# Patient Record
Sex: Male | Born: 1955 | Race: White | Hispanic: No | Smoking: Former smoker
Health system: Southern US, Community
[De-identification: ages and names within clinical notes are randomized; demographics above are authoritative.]

## PROBLEM LIST (undated history)

## (undated) ENCOUNTER — Emergency Department (HOSPITAL_BASED_OUTPATIENT_CLINIC_OR_DEPARTMENT_OTHER): Admission: EM | Payer: 59 | Source: Home / Self Care

## (undated) DIAGNOSIS — E079 Disorder of thyroid, unspecified: Secondary | ICD-10-CM

## (undated) DIAGNOSIS — M109 Gout, unspecified: Secondary | ICD-10-CM

## (undated) DIAGNOSIS — H269 Unspecified cataract: Secondary | ICD-10-CM

## (undated) DIAGNOSIS — M1990 Unspecified osteoarthritis, unspecified site: Secondary | ICD-10-CM

## (undated) DIAGNOSIS — H409 Unspecified glaucoma: Secondary | ICD-10-CM

## (undated) DIAGNOSIS — D689 Coagulation defect, unspecified: Secondary | ICD-10-CM

## (undated) DIAGNOSIS — J45909 Unspecified asthma, uncomplicated: Secondary | ICD-10-CM

## (undated) DIAGNOSIS — E7800 Pure hypercholesterolemia, unspecified: Secondary | ICD-10-CM

## (undated) DIAGNOSIS — I10 Essential (primary) hypertension: Secondary | ICD-10-CM

## (undated) DIAGNOSIS — K5792 Diverticulitis of intestine, part unspecified, without perforation or abscess without bleeding: Secondary | ICD-10-CM

## (undated) DIAGNOSIS — N289 Disorder of kidney and ureter, unspecified: Secondary | ICD-10-CM

## (undated) HISTORY — DX: Unspecified osteoarthritis, unspecified site: M19.90

## (undated) HISTORY — DX: Unspecified glaucoma: H40.9

## (undated) HISTORY — DX: Disorder of kidney and ureter, unspecified: N28.9

## (undated) HISTORY — DX: Disorder of thyroid, unspecified: E07.9

## (undated) HISTORY — DX: Unspecified cataract: H26.9

## (undated) HISTORY — DX: Coagulation defect, unspecified: D68.9

## (undated) HISTORY — DX: Unspecified asthma, uncomplicated: J45.909

## (undated) HISTORY — PX: EYE SURGERY: SHX253

## (undated) HISTORY — PX: JOINT REPLACEMENT: SHX530

## (undated) HISTORY — PX: CERVICAL DISCECTOMY: SHX98

---

## 1998-08-29 ENCOUNTER — Other Ambulatory Visit: Admission: RE | Admit: 1998-08-29 | Discharge: 1998-08-29 | Payer: Self-pay | Admitting: Internal Medicine

## 1999-05-21 ENCOUNTER — Inpatient Hospital Stay (HOSPITAL_COMMUNITY): Admission: EM | Admit: 1999-05-21 | Discharge: 1999-05-23 | Payer: Self-pay | Admitting: Emergency Medicine

## 1999-05-21 ENCOUNTER — Encounter: Payer: Self-pay | Admitting: Emergency Medicine

## 1999-05-22 ENCOUNTER — Encounter: Payer: Self-pay | Admitting: General Surgery

## 2003-06-05 ENCOUNTER — Ambulatory Visit (HOSPITAL_COMMUNITY): Admission: RE | Admit: 2003-06-05 | Discharge: 2003-06-05 | Payer: Self-pay | Admitting: Neurosurgery

## 2003-12-17 ENCOUNTER — Emergency Department (HOSPITAL_COMMUNITY): Admission: EM | Admit: 2003-12-17 | Discharge: 2003-12-17 | Payer: Self-pay | Admitting: Family Medicine

## 2004-01-09 ENCOUNTER — Encounter: Admission: RE | Admit: 2004-01-09 | Discharge: 2004-01-09 | Payer: Self-pay | Admitting: Sports Medicine

## 2004-02-14 ENCOUNTER — Encounter: Admission: RE | Admit: 2004-02-14 | Discharge: 2004-02-14 | Payer: Self-pay | Admitting: Sports Medicine

## 2004-03-06 ENCOUNTER — Ambulatory Visit: Payer: Self-pay | Admitting: Family Medicine

## 2004-04-18 ENCOUNTER — Ambulatory Visit: Payer: Self-pay | Admitting: Family Medicine

## 2004-05-28 ENCOUNTER — Ambulatory Visit: Payer: Self-pay | Admitting: Family Medicine

## 2004-06-26 ENCOUNTER — Ambulatory Visit: Payer: Self-pay | Admitting: Family Medicine

## 2004-06-26 ENCOUNTER — Ambulatory Visit (HOSPITAL_COMMUNITY): Admission: RE | Admit: 2004-06-26 | Discharge: 2004-06-26 | Payer: Self-pay | Admitting: Family Medicine

## 2004-07-11 ENCOUNTER — Ambulatory Visit: Payer: Self-pay | Admitting: Sports Medicine

## 2004-07-11 ENCOUNTER — Ambulatory Visit (HOSPITAL_COMMUNITY): Admission: RE | Admit: 2004-07-11 | Discharge: 2004-07-11 | Payer: Self-pay | Admitting: Sports Medicine

## 2004-08-01 ENCOUNTER — Ambulatory Visit: Payer: Self-pay | Admitting: Family Medicine

## 2005-06-11 ENCOUNTER — Emergency Department (HOSPITAL_COMMUNITY): Admission: EM | Admit: 2005-06-11 | Discharge: 2005-06-11 | Payer: Self-pay | Admitting: Emergency Medicine

## 2005-06-15 ENCOUNTER — Ambulatory Visit: Payer: Self-pay | Admitting: Family Medicine

## 2006-01-28 ENCOUNTER — Inpatient Hospital Stay (HOSPITAL_COMMUNITY): Admission: EM | Admit: 2006-01-28 | Discharge: 2006-01-29 | Payer: Self-pay | Admitting: Emergency Medicine

## 2006-01-28 ENCOUNTER — Ambulatory Visit: Payer: Self-pay | Admitting: Family Medicine

## 2006-02-22 ENCOUNTER — Ambulatory Visit: Payer: Self-pay | Admitting: Family Medicine

## 2006-05-03 ENCOUNTER — Ambulatory Visit: Payer: Self-pay | Admitting: Family Medicine

## 2006-05-25 ENCOUNTER — Ambulatory Visit: Payer: Self-pay | Admitting: Family Medicine

## 2006-08-13 ENCOUNTER — Ambulatory Visit: Payer: Self-pay | Admitting: Family Medicine

## 2006-08-16 ENCOUNTER — Encounter: Payer: Self-pay | Admitting: Family Medicine

## 2006-08-16 ENCOUNTER — Ambulatory Visit: Payer: Self-pay | Admitting: Family Medicine

## 2006-08-16 LAB — CONVERTED CEMR LAB
Cholesterol: 138 mg/dL (ref 0–200)
HDL: 33 mg/dL — ABNORMAL LOW (ref >39)
Total CHOL/HDL Ratio: 4.2
Triglycerides: 208 mg/dL — ABNORMAL HIGH (ref <150)
VLDL: 42 mg/dL — ABNORMAL HIGH (ref 0–40)

## 2006-08-19 ENCOUNTER — Emergency Department (HOSPITAL_COMMUNITY): Admission: EM | Admit: 2006-08-19 | Discharge: 2006-08-19 | Payer: Self-pay | Admitting: Emergency Medicine

## 2006-09-02 DIAGNOSIS — E785 Hyperlipidemia, unspecified: Secondary | ICD-10-CM

## 2006-09-02 DIAGNOSIS — M545 Low back pain, unspecified: Secondary | ICD-10-CM | POA: Insufficient documentation

## 2006-09-02 DIAGNOSIS — E781 Pure hyperglyceridemia: Secondary | ICD-10-CM | POA: Insufficient documentation

## 2006-09-02 DIAGNOSIS — Z87891 Personal history of nicotine dependence: Secondary | ICD-10-CM

## 2006-09-02 DIAGNOSIS — E1169 Type 2 diabetes mellitus with other specified complication: Secondary | ICD-10-CM | POA: Insufficient documentation

## 2006-09-02 DIAGNOSIS — E119 Type 2 diabetes mellitus without complications: Secondary | ICD-10-CM | POA: Insufficient documentation

## 2006-10-14 ENCOUNTER — Telehealth: Payer: Self-pay | Admitting: *Deleted

## 2006-10-15 ENCOUNTER — Ambulatory Visit: Payer: Self-pay | Admitting: Family Medicine

## 2006-10-15 DIAGNOSIS — I10 Essential (primary) hypertension: Secondary | ICD-10-CM | POA: Insufficient documentation

## 2006-10-15 DIAGNOSIS — M549 Dorsalgia, unspecified: Secondary | ICD-10-CM | POA: Insufficient documentation

## 2006-11-10 ENCOUNTER — Telehealth (INDEPENDENT_AMBULATORY_CARE_PROVIDER_SITE_OTHER): Payer: Self-pay | Admitting: Family Medicine

## 2006-11-10 ENCOUNTER — Encounter: Payer: Self-pay | Admitting: Family Medicine

## 2006-11-15 ENCOUNTER — Encounter: Payer: Self-pay | Admitting: Family Medicine

## 2006-11-15 ENCOUNTER — Telehealth: Payer: Self-pay | Admitting: Family Medicine

## 2006-11-16 ENCOUNTER — Encounter: Payer: Self-pay | Admitting: Family Medicine

## 2006-11-25 ENCOUNTER — Telehealth: Payer: Self-pay | Admitting: *Deleted

## 2007-01-21 ENCOUNTER — Telehealth (INDEPENDENT_AMBULATORY_CARE_PROVIDER_SITE_OTHER): Payer: Self-pay | Admitting: Family Medicine

## 2007-02-04 ENCOUNTER — Telehealth (INDEPENDENT_AMBULATORY_CARE_PROVIDER_SITE_OTHER): Payer: Self-pay | Admitting: Family Medicine

## 2007-02-09 ENCOUNTER — Encounter (INDEPENDENT_AMBULATORY_CARE_PROVIDER_SITE_OTHER): Payer: Self-pay | Admitting: Family Medicine

## 2007-05-03 ENCOUNTER — Encounter (INDEPENDENT_AMBULATORY_CARE_PROVIDER_SITE_OTHER): Payer: Self-pay | Admitting: Family Medicine

## 2007-07-14 ENCOUNTER — Encounter (INDEPENDENT_AMBULATORY_CARE_PROVIDER_SITE_OTHER): Payer: Self-pay | Admitting: Family Medicine

## 2007-07-15 ENCOUNTER — Telehealth (INDEPENDENT_AMBULATORY_CARE_PROVIDER_SITE_OTHER): Payer: Self-pay | Admitting: Family Medicine

## 2007-10-31 ENCOUNTER — Telehealth: Payer: Self-pay | Admitting: *Deleted

## 2007-11-01 ENCOUNTER — Ambulatory Visit: Payer: Self-pay | Admitting: Family Medicine

## 2007-11-25 ENCOUNTER — Telehealth: Payer: Self-pay | Admitting: *Deleted

## 2007-12-19 ENCOUNTER — Telehealth: Payer: Self-pay | Admitting: *Deleted

## 2007-12-20 ENCOUNTER — Telehealth (INDEPENDENT_AMBULATORY_CARE_PROVIDER_SITE_OTHER): Payer: Self-pay | Admitting: Family Medicine

## 2008-01-19 IMAGING — CR DG LUMBAR SPINE COMPLETE 4+V
5 series · 5 of 5 positions shown · non-contrast
Comparison: MRI dated 06/05/03.

CLINICAL DATA: Mid low back pain for four days.  Multiple lower back surgeries.  History of cervical spine surgery.
 LUMBAR SPINE ? 4 VIEW ? 08/19/06:

[t l-spine a.p.]
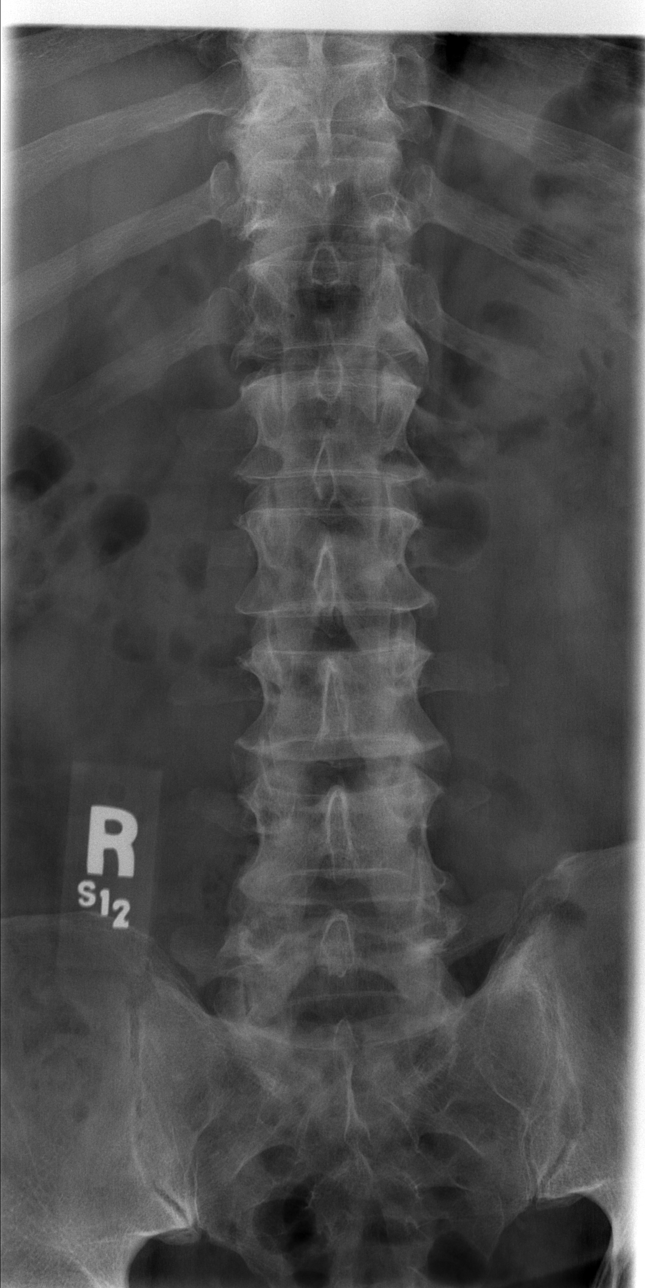

[t l-spine oblique exposure (1 of 2)]
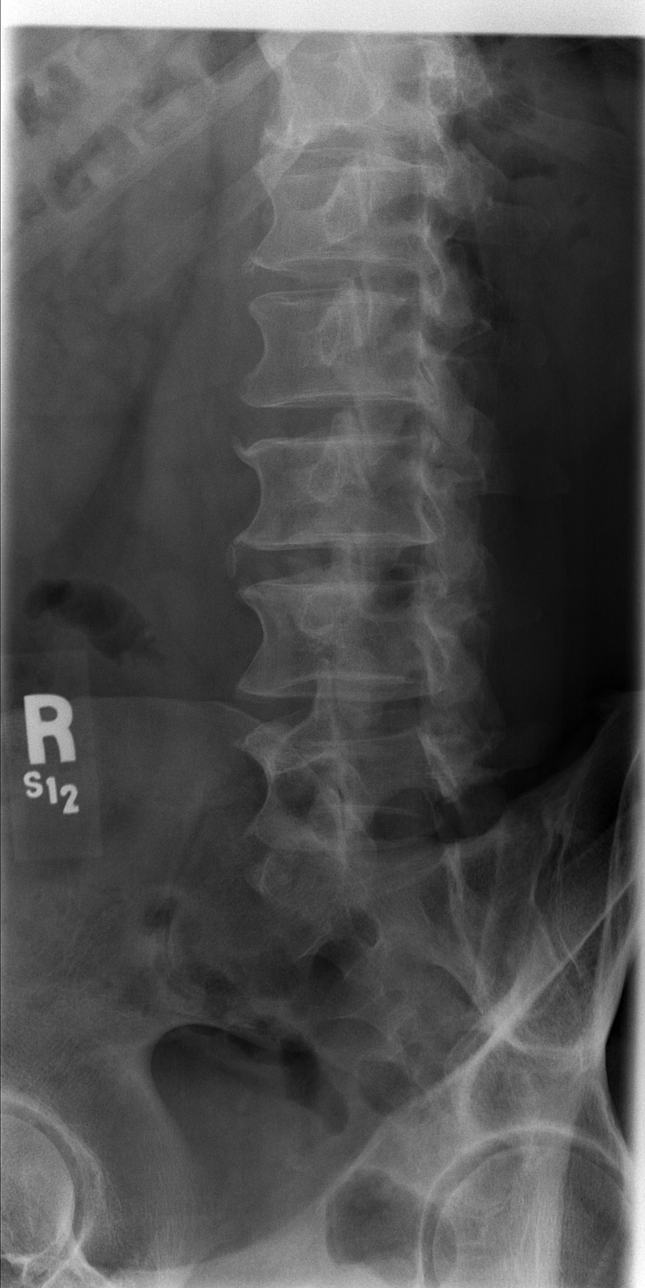

[t l-spine oblique exposure (2 of 2)]
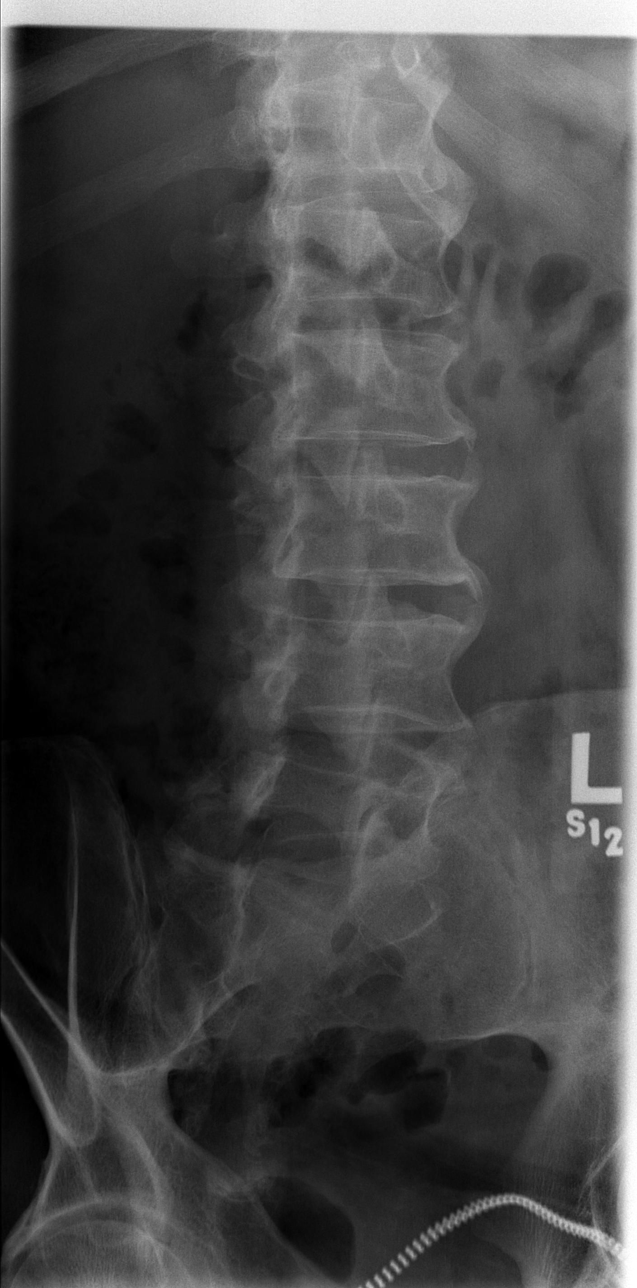

[t l-spine lat]
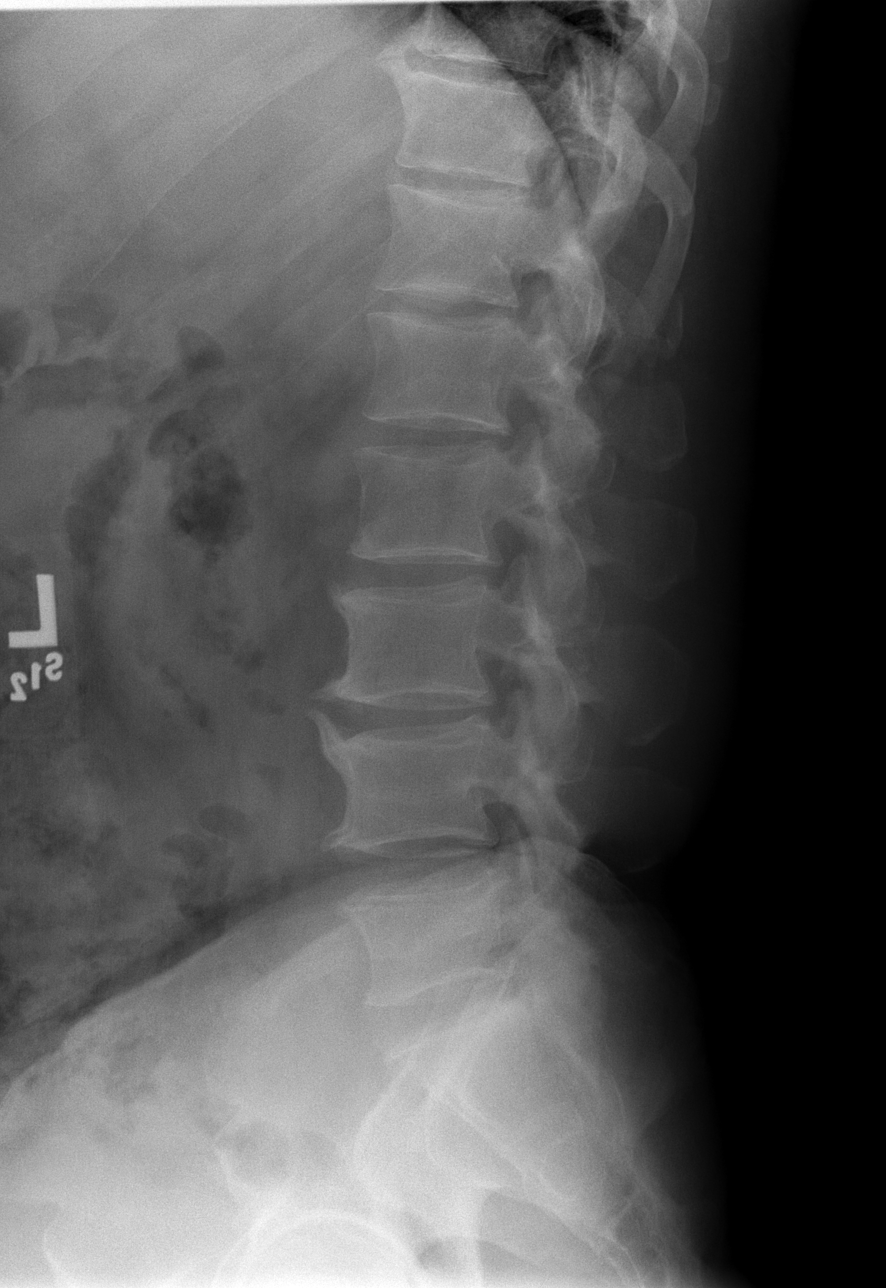

[t l-spine l5-s1 spot]
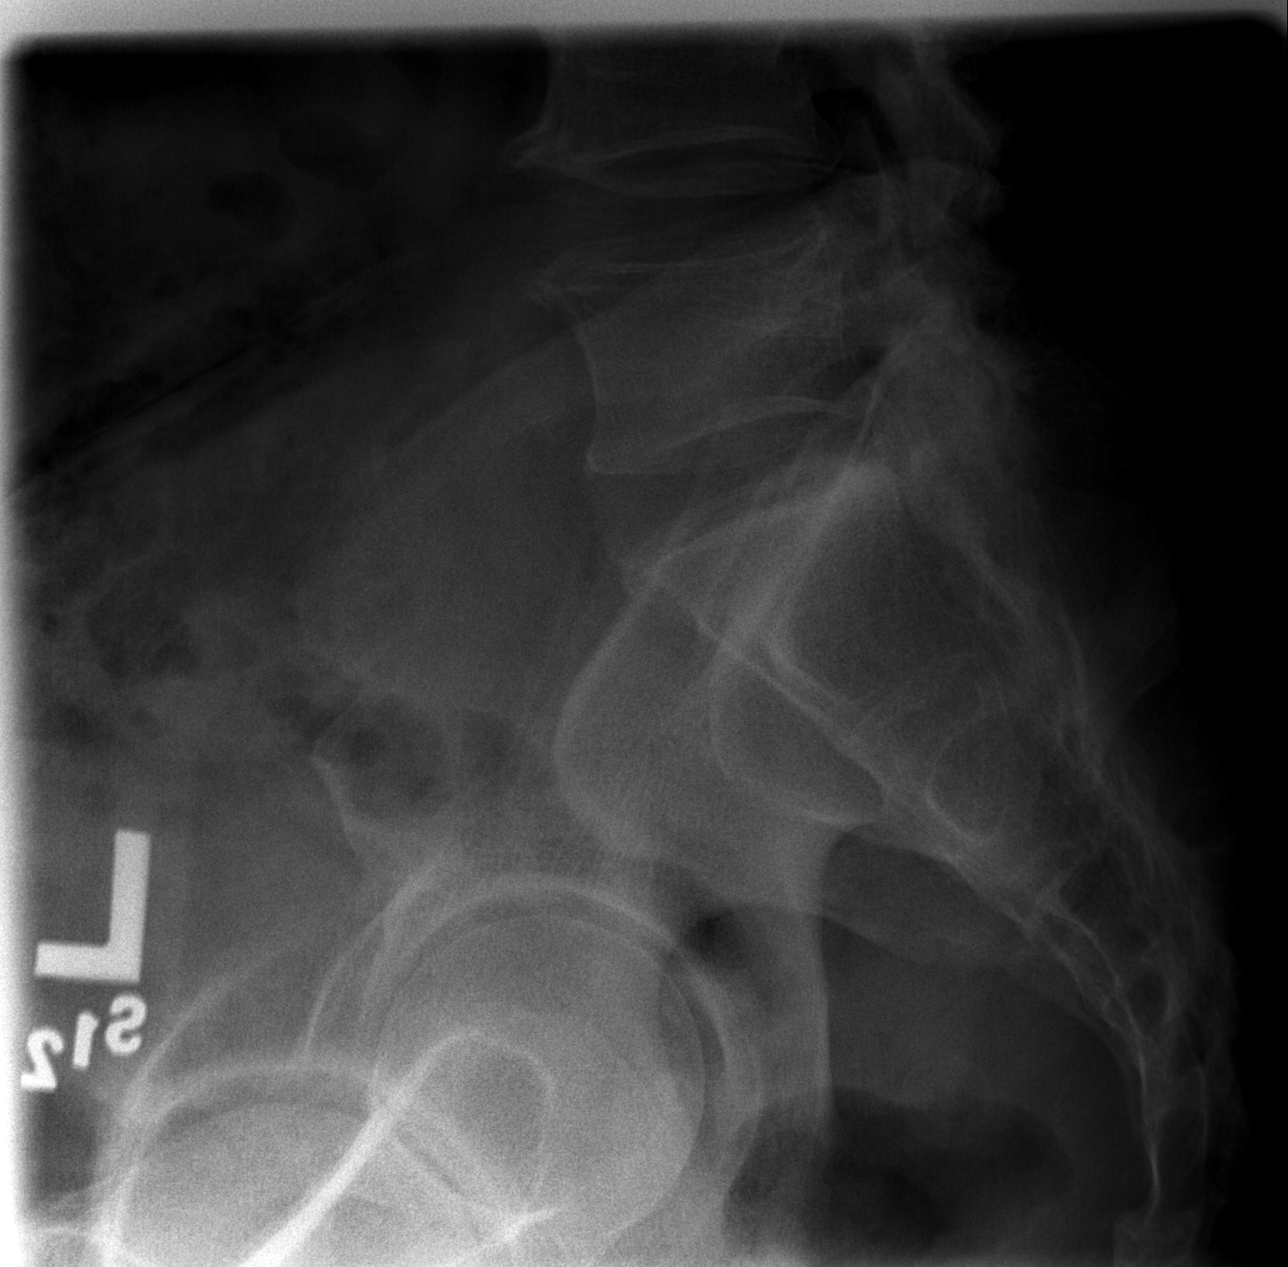

[5 of 5 positions shown; findings below may reference images not displayed]

FINDINGS: The alignment of the lumbar spine is normal.  The vertebral body heights and the disk spaces are well preserved. The facet joints are all well aligned.  The spinous processes, pedicles, and transverse processes are all normal.  There is mild multilevel anterior osteophyte formation.
IMPRESSION: No acute findings.

## 2008-03-28 ENCOUNTER — Encounter (INDEPENDENT_AMBULATORY_CARE_PROVIDER_SITE_OTHER): Payer: Self-pay | Admitting: Family Medicine

## 2008-03-28 ENCOUNTER — Ambulatory Visit: Payer: Self-pay | Admitting: Family Medicine

## 2008-03-28 DIAGNOSIS — E669 Obesity, unspecified: Secondary | ICD-10-CM

## 2008-03-28 DIAGNOSIS — M109 Gout, unspecified: Secondary | ICD-10-CM | POA: Insufficient documentation

## 2008-03-29 ENCOUNTER — Telehealth (INDEPENDENT_AMBULATORY_CARE_PROVIDER_SITE_OTHER): Payer: Self-pay | Admitting: Family Medicine

## 2008-04-18 ENCOUNTER — Telehealth: Payer: Self-pay | Admitting: *Deleted

## 2008-04-25 ENCOUNTER — Telehealth: Payer: Self-pay | Admitting: *Deleted

## 2008-06-24 LAB — CONVERTED CEMR LAB
CO2: 24 meq/L (ref 19–32)
Creatinine, Ser: 0.96 mg/dL (ref 0.40–1.50)
HDL: 38 mg/dL — ABNORMAL LOW (ref >39)
LDL Cholesterol: 135 mg/dL — ABNORMAL HIGH (ref 0–99)
VLDL: 32 mg/dL (ref 0–40)

## 2008-07-21 ENCOUNTER — Encounter (INDEPENDENT_AMBULATORY_CARE_PROVIDER_SITE_OTHER): Payer: Self-pay | Admitting: Family Medicine

## 2008-09-05 ENCOUNTER — Telehealth (INDEPENDENT_AMBULATORY_CARE_PROVIDER_SITE_OTHER): Payer: Self-pay | Admitting: Family Medicine

## 2008-09-10 ENCOUNTER — Telehealth (INDEPENDENT_AMBULATORY_CARE_PROVIDER_SITE_OTHER): Payer: Self-pay | Admitting: Family Medicine

## 2008-09-11 ENCOUNTER — Encounter (INDEPENDENT_AMBULATORY_CARE_PROVIDER_SITE_OTHER): Payer: Self-pay | Admitting: Family Medicine

## 2008-09-11 ENCOUNTER — Ambulatory Visit: Payer: Self-pay | Admitting: Family Medicine

## 2008-09-11 DIAGNOSIS — B351 Tinea unguium: Secondary | ICD-10-CM

## 2008-09-11 LAB — CONVERTED CEMR LAB: Hgb A1c MFr Bld: 7.7 %

## 2008-09-12 ENCOUNTER — Encounter (INDEPENDENT_AMBULATORY_CARE_PROVIDER_SITE_OTHER): Payer: Self-pay | Admitting: Family Medicine

## 2008-09-12 LAB — CONVERTED CEMR LAB
ALT: 39 units/L (ref 0–53)
AST: 25 units/L (ref 0–37)
CO2: 21 meq/L (ref 19–32)
Calcium: 10.1 mg/dL (ref 8.4–10.5)
Chloride: 105 meq/L (ref 96–112)
Cholesterol: 155 mg/dL (ref 0–200)
Creatinine, Ser: 0.83 mg/dL (ref 0.40–1.50)
HDL: 36 mg/dL — ABNORMAL LOW (ref >39)
Sodium: 140 meq/L (ref 135–145)
Total CHOL/HDL Ratio: 4.3

## 2008-09-25 ENCOUNTER — Telehealth (INDEPENDENT_AMBULATORY_CARE_PROVIDER_SITE_OTHER): Payer: Self-pay | Admitting: *Deleted

## 2008-10-04 ENCOUNTER — Telehealth: Payer: Self-pay | Admitting: *Deleted

## 2008-10-08 ENCOUNTER — Ambulatory Visit: Payer: Self-pay | Admitting: Family Medicine

## 2008-10-19 ENCOUNTER — Telehealth (INDEPENDENT_AMBULATORY_CARE_PROVIDER_SITE_OTHER): Payer: Self-pay | Admitting: *Deleted

## 2008-12-03 ENCOUNTER — Emergency Department (HOSPITAL_COMMUNITY): Admission: EM | Admit: 2008-12-03 | Discharge: 2008-12-04 | Payer: Self-pay | Admitting: Emergency Medicine

## 2009-02-27 ENCOUNTER — Ambulatory Visit: Payer: Self-pay | Admitting: Family Medicine

## 2009-02-27 DIAGNOSIS — N529 Male erectile dysfunction, unspecified: Secondary | ICD-10-CM | POA: Insufficient documentation

## 2009-02-27 LAB — CONVERTED CEMR LAB: Hgb A1c MFr Bld: 7.5 %

## 2009-03-05 ENCOUNTER — Telehealth: Payer: Self-pay | Admitting: Family Medicine

## 2009-04-15 ENCOUNTER — Telehealth (INDEPENDENT_AMBULATORY_CARE_PROVIDER_SITE_OTHER): Payer: Self-pay | Admitting: *Deleted

## 2009-09-25 ENCOUNTER — Telehealth: Payer: Self-pay | Admitting: *Deleted

## 2009-10-03 ENCOUNTER — Telehealth: Payer: Self-pay | Admitting: Family Medicine

## 2009-10-04 ENCOUNTER — Encounter: Payer: Self-pay | Admitting: *Deleted

## 2010-08-07 NOTE — Miscellaneous (Signed)
Summary: Pt now inactive - moved to [place]  Pt moved to [place].  Dennison Nancy RN  October 04, 2009 11:27 AM

## 2010-08-07 NOTE — Progress Notes (Signed)
Summary: DMV info  ---- Converted from flag ---- ---- 09/27/2009 2:56 PM, Earl Harvey CMA wrote: spoke with [name] and he said that this form in NOT for a DOT license but for a regular license.  I informed him that he would need to be seen by you before you would do anything and he would like for you  to please call him [phone].  thanks :-)  called pt and he is going to get a physician in [place].  has appt scheduled.  new physician will fill out form.  he is getting ROI to [place] so that he can get our records to new physician ------------------------------

## 2010-08-07 NOTE — Progress Notes (Signed)
Summary: phn msg  Phone Note Outgoing Call   Summary of Call: Would you call Mr Stuck and ask him to come in for an appointment.  He has not been seen since October and he has left a DMV form form me to fill out.  The medicines he listed on his form do not completely match the list I have.  I cannot fill out his form until I see him.  Thanks! Initial call taken by: Asher Muir MD,  September 25, 2009 2:06 PM  Follow-up for Phone Call        Pt says that he [place] come [place]  [place]  [phone].   Follow-up by: Clydell Hakim,  September 26, 2009 10:06 AM  Additional Follow-up for Phone Call Additional follow up Details #1::        Then he will need to find a physician in [place] to see him and fill out this form, and more importantly, to help take care of his diabetes.  We can send him back the form if he needs it. Additional Follow-up by: Asher Muir MD,  September 26, 2009 10:28 AM    mr. Newbury stated that this will mess him up with obtaining his [place] license and that he had accidentaly filled out incorrect information about the dosage or type of medication that he is taking and he was informed that only the last doctor that has seen him last can fill out this paperwork for him. i told him that you wanted to see him in order to do this, but he stated that because he has just started his job he cannot take time off of work to do this.Loralee Pacas CMA  September 26, 2009 3:34 PM  Appended Document: phn msg I am sorry that he feels that way, but he must get a physician in [place].

## 2010-10-13 LAB — GLUCOSE, CAPILLARY: Glucose-Capillary: 162 mg/dL — ABNORMAL HIGH (ref 70–99)

## 2010-11-21 NOTE — Discharge Summary (Signed)
NAMEJAXYN, Earl Harvey                 ACCOUNT NO.:  [id number]   MEDICAL RECORD NO.:  [id number]          PATIENT TYPE:  INP   LOCATION:  3736                         FACILITY:  [facility]   PHYSICIAN:  Leighton Roach McDiarmid, M.D.DATE OF BIRTH:  June 09, 1956   DATE OF ADMISSION:  01/28/2006  DATE OF DISCHARGE:  01/29/2006                                 DISCHARGE SUMMARY   DISCHARGE DIAGNOSES:  1.  Atypical chest pain.  2.  Hyperkalemia.  3.  Muscle twitches.  4.  Diabetes mellitus type 2.  5.  Gout.  6.  Hypertension.   DISCHARGE MEDICATIONS:  1.  Lisinopril 10 mg daily.  2.  Aspirin 81 mg daily.  3.  NPH insulin 45 units every morning, 23 units every night.   FOLLOWUP:  The patient is to return to Dr. Tanya Nones at the [facility] on August 8th at 3:45 p.m.   CONSULTS:  None.   PROCEDURES:  None.   HOSPITAL COURSE BY PROBLEM:  1.  Chest flutter:  [name] is a 55 year old white male with a history of      diabetes who complains of a 2-3 months feeling of a fluttering in his      left chest with no pain or pressure, no associated nausea, vomiting,      diaphoresis or radiation.  The fluttering increased in frequency on the      date of admission.  He also has had the same feeling in his lower      abdomen at times.  The differential for his chest flutter included      muscle twitch, atrial fibrillation, atrial flutter, other irregular      heart beats.  We admitted the patient for 23-hour observation to rule      out myocardial infarction.  We admitted to him to telemetry to watch for      arrhythmias, PBCs, PACs, any kind of other arrhythmias.  The patient      remained in normal sinus rhythm over night, and his chest fluttering      symptoms resolved shortly after being started on IV fluids.  Patient      also, the following morning, described sometimes that his pectoralis      muscle on the left side would draw up with the fluttering.  This      combined with  negative cardiac workup including 3 sets of negative      cardiac enzymes and 2 EKGs showing normal sinus rhythm makes the      diagnosis of muscle twitch the most likely especially given that his      symptoms resolved with hydration.  We discharged the patient on 81 mg of      aspirin daily, and was instructions to continue his lisinopril and      insulin to minimize his risk factors for cardiac disease.  2.  Hyperkalemia:  On admission, the patient was found to have a potassium      of 5.4.  Over night, the patient's potassium decreased to within normal  limits.  Likely his potassium was increased secondary to dehydration due      to hyperglycemia.  Patient will need followup BMP.  3.  Diabetes mellitus type 2:  Patient is poorly controlled with an A1c of      9.5.  He reports that he cannot pay for his      Lantus, and has been borrowing his friend's, which he has extra.  We      switched him to an equivalent dose of NPH.  Patient will need followup      A1c in 3 months.  4.  Hypertension:  Patient with stable blood pressure during this admission.      We continued his lisinopril.     ______________________________  Levander Campion, M.D.    ______________________________  Leighton Roach McDiarmid, M.D.    JH/MEDQ  D:  02/01/2006  T:  02/02/2006  Job:  [id number]   cc:   Leighton Roach McDiarmid, M.D.  Fax: [phone]   Priscille Heidelberg. Pamalee Leyden, MD  Fax: [phone]
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
## 2011-05-19 ENCOUNTER — Encounter: Payer: Self-pay | Admitting: Family Medicine

## 2011-05-19 ENCOUNTER — Ambulatory Visit (INDEPENDENT_AMBULATORY_CARE_PROVIDER_SITE_OTHER): Payer: Self-pay | Admitting: Family Medicine

## 2011-05-19 VITALS — BP 121/82 | HR 76 | Ht 69.0 in | Wt 229.0 lb

## 2011-05-19 DIAGNOSIS — I10 Essential (primary) hypertension: Secondary | ICD-10-CM

## 2011-05-19 DIAGNOSIS — E782 Mixed hyperlipidemia: Secondary | ICD-10-CM | POA: Insufficient documentation

## 2011-05-19 DIAGNOSIS — E1159 Type 2 diabetes mellitus with other circulatory complications: Secondary | ICD-10-CM | POA: Insufficient documentation

## 2011-05-19 DIAGNOSIS — E1169 Type 2 diabetes mellitus with other specified complication: Secondary | ICD-10-CM | POA: Insufficient documentation

## 2011-05-19 DIAGNOSIS — E119 Type 2 diabetes mellitus without complications: Secondary | ICD-10-CM

## 2011-05-19 DIAGNOSIS — E785 Hyperlipidemia, unspecified: Secondary | ICD-10-CM | POA: Insufficient documentation

## 2011-05-19 DIAGNOSIS — H547 Unspecified visual loss: Secondary | ICD-10-CM | POA: Insufficient documentation

## 2011-05-19 DIAGNOSIS — M109 Gout, unspecified: Secondary | ICD-10-CM | POA: Insufficient documentation

## 2011-05-19 LAB — POCT GLYCOSYLATED HEMOGLOBIN (HGB A1C): Hemoglobin A1C: 8.7

## 2011-05-19 MED ORDER — METFORMIN HCL 1000 MG PO TABS: 1000.0000 mg | ORAL_TABLET | Freq: Two times a day (BID) | ORAL | Status: DC

## 2011-05-19 MED ORDER — LISINOPRIL-HYDROCHLOROTHIAZIDE 20-25 MG PO TABS: 1.0000 | ORAL_TABLET | Freq: Every day | ORAL | Status: DC

## 2011-05-19 MED ORDER — PRAVASTATIN SODIUM 40 MG PO TABS: 40.0000 mg | ORAL_TABLET | Freq: Every day | ORAL | Status: DC

## 2011-05-19 MED ORDER — AMLODIPINE BESYLATE 5 MG PO TABS: 5.0000 mg | ORAL_TABLET | Freq: Every day | ORAL | Status: DC

## 2011-05-19 MED ORDER — ALLOPURINOL 300 MG PO TABS: 300.0000 mg | ORAL_TABLET | ORAL | Status: DC

## 2011-05-19 NOTE — Progress Notes (Signed)
Subjective:    Patient ID: Earl Harvey, male    DOB: Nov 26, 1955, 55 y.o.   MRN: [id number]  Diabetes Earl Harvey presents for his follow-up diabetic visit. Earl Harvey has type 1 diabetes mellitus. His disease course has been worsening. There are no hypoglycemic associated symptoms. Associated symptoms include blurred vision, fatigue, foot paresthesias, visual change and weakness. There are no hypoglycemic complications. Symptoms are worsening. Diabetic complications include impotence, nephropathy, peripheral neuropathy and PVD. Pertinent negatives for diabetic complications include no CVA or heart disease. Risk factors for coronary artery disease include male sex, sedentary lifestyle, stress, hypertension, diabetes mellitus, dyslipidemia and family history. Current diabetic treatment includes insulin injections and oral agent (monotherapy). His weight is stable. Earl Harvey is following a diabetic diet. Meal planning includes avoidance of concentrated sweets. Earl Harvey has not had a previous visit with a dietician. Earl Harvey rarely participates in exercise. An ACE inhibitor/angiotensin II receptor blocker is being taken. Earl Harvey does not see a podiatrist.Eye exam is not current.  Hypertension This is a chronic problem. The current episode started more than 1 year ago. Associated symptoms include blurred vision. Risk factors for coronary artery disease include male gender, obesity, dyslipidemia, diabetes mellitus and family history. Past treatments include ACE inhibitors. Compliance problems include exercise, diet and medication cost.  Hypertensive end-organ damage includes PVD. There is no history of CVA.   Earl Harvey is taking lisinopril 20/25 twice a day explained to him this is double the maximum dosage. Earl Harvey states that a doctor prescribed that but explained to him that if they can get blood work showing that his kidney function is acceptable as one thing since Earl Harvey is unable to afford lab work I can't not prescribe this dosage and we'll need to add another  blood pressure medicine to his regimen.   Visual impairment Earl Harvey reports feeling like Earl Harvey needs to go on disability because of his visual problems. Earl Harvey states is worse with driving at night and that Earl Harvey has trouble with his vision.     Neuropathy Earl Harvey reports loss of sensation in both lower extremities especially his right   Gout Earl Harvey is taking his allopurinol 3 times a week Earl Harvey states that his uric acid levels have been fine that way. Test Earl Harvey does not want any blood work drawn because of the cost I will re\re prescribe his gout medicine that way Review of Systems  Constitutional: Positive for fatigue.  Eyes: Positive for blurred vision and visual disturbance.  Genitourinary: Positive for impotence.  Musculoskeletal: Positive for myalgias, arthralgias and gait problem.  Neurological: Positive for weakness.   BP 121/82  Pulse 76  Ht 5\' 9"  (1.753 m)  Wt 229 lb (103.874 kg)  BMI 33.82 kg/m2  SpO2 98% Results for orders placed in visit on 05/19/11  POCT GLYCOSYLATED HEMOGLOBIN (HGB A1C)      Component Value Range   Hemoglobin A1C 8.7         Objective:   Physical Exam  Constitutional: Earl Harvey is oriented to person, place, and time. Earl Harvey appears well-developed and well-nourished.  HENT:  Head: Normocephalic.  Neck: Normal range of motion. Neck supple.  Cardiovascular: Normal rate, regular rhythm and normal heart sounds.   Pulmonary/Chest: Effort normal and breath sounds normal.  Musculoskeletal: Normal range of motion.  Neurological: Earl Harvey is alert and oriented to person, place, and time.  Skin: Skin is warm and dry.  Psychiatric: Earl Harvey has a normal mood and affect.          Assessment & Plan:  #1 diabetes  repeatedly Earl Harvey's deformity Earl Harvey has no funds to see other doctors make any major changes in medication or purchase things aren't we talked about. Earl Harvey wants to go to the novel and 7030 explained to him that for now for some 30 to be a step back from the Lantus a significant step back from Lantus and a  few seconds about diabetes Earl Harvey cannot go to 7030 been on the Lantus and expect to change his diabetes. Didn't increase his Lantus to 100 units Earl Harvey does have him about this and will use that once I spoke with Earl Harvey seems to go back to the novel and 7030. Earl Harvey is on metformin twice a day will add Januvia 100 mg one tablet a day and samples given to patient last obese 8-10 weeks until [facility] [place].  #2 visual impairment patient is legally blind in the right eye. 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 Explained Earl Harvey needs to see an ophthalmologist and that if Earl Harvey wants to really look disability Earl Harvey may be disabled because of his visual impairment. Once again because Earl Harvey has no insurance or funds Earl [facility] [place]. #3 hypertension as above I cannot ask her to continue on the Lotensin 20/25 twice a day will add Norvasc 10 mg explained that this should be hopefully in the $4 class.  #4 hyperlipidemia unable to obtain lipid panel we'll maintain him on his Pravachol.  Return when either Earl Harvey says is on  the [employer], Earl Harvey gets insurance, disability, or she's done anything [facility].

## 2011-05-19 NOTE — Patient Instructions (Signed)
Diabetes and Foot Care Diabetes may cause you to have a poor blood supply (circulation) to your legs and feet. Because of this, the skin may be thinner, break easier, and heal more slowly. You also may have nerve damage in your legs and feet causing decreased feeling. You may not notice minor injuries to your feet that could lead to serious problems or infections. Taking care of your feet is one of the most important things you can do for yourself.  HOME CARE INSTRUCTIONS  Do not go barefoot. Bare feet are easily injured.   Check your feet daily for blisters, cuts, and redness.   Wash your feet with warm water (not hot) and mild soap. Pat your feet and between your toes until completely dry.   Apply a moisturizing lotion that does not contain alcohol or petroleum jelly to the dry skin on your feet and to dry brittle toenails. Do not put it between your toes.   Trim your toenails straight across. Do not dig under them or around the cuticle.   Do not cut corns or calluses, or try to remove them with medicine.   Wear clean cotton socks or stockings every day. Make sure they are not too tight. Do not wear knee high stockings since they may decrease blood flow to your legs.   Wear leather shoes that fit properly and have enough cushioning. To break in new shoes, wear them just a few hours a day to avoid injuring your feet.   Wear shoes at all times, even in the house.   Do not cross your legs. This may decrease the blood flow to your feet.   If you find a minor scrape, cut, or break in the skin on your feet, keep it and the skin around it clean and dry. These areas may be cleansed with mild soap and water. Do not use peroxide, alcohol, iodine or Merthiolate.   When you remove an adhesive bandage, be sure not to harm the skin around it.   If you have a wound, look at it several times a day to make sure it is healing.   Do not use heating pads or hot water bottles. Burns can occur. If you have  lost feeling in your feet or legs, you may not know it is happening until it is too late.   Report any cuts, sores or bruises to your caregiver. Do not wait!  SEEK MEDICAL CARE IF:   You have an injury that is not healing or you notice redness, numbness, burning, or tingling.   Your feet always feel cold.   You have pain or cramps in your legs and feet.  SEEK IMMEDIATE MEDICAL CARE IF:   There is increasing redness, swelling, or increasing pain in the wound.   There is a red line that goes up your leg.   Pus is coming from a wound.   You develop an unexplained oral temperature above 102 F (38.9 C), or as your caregiver suggests.   You notice a bad smell coming from an ulcer or wound.  MAKE SURE YOU:   Understand these instructions.   Will watch your condition.   Will get help right away if you are not doing well or get worse.  Document Released: 06/19/2000 Document Revised: 03/04/2011 Document Reviewed: 12/26/2008 [facility] Patient Information 2012 [place], [place].Driving and Equipment Restrictions Some medical problems make it dangerous to drive, ride a bike, or use machines. Some of these problems are:  A hard  blow to the head (concussion).   Passing out (fainting).   Twitching and shaking (seizures).   Low blood sugar.   Taking medicine to help you relax (sedatives).   Taking pain medicines.   Wearing an eye patch.   Wearing splints. This can make it hard to use parts of your body that you need to drive safely.  HOME CARE   Do not drive until your doctor says it is okay.   Do not use machines until your doctor says it is okay.  You may need a form signed by your doctor (medical release) before you can drive again. You may also need this form before you do other tasks where you need to be fully alert. MAKE SURE YOU:  Understand these instructions.   Will watch your condition.   Will get help right away if you are not doing well or get worse.  Document  Released: 07/30/2004 Document Revised: 03/04/2011 Document Reviewed: 10/30/2009 [facility] Patient Information 2012 [place], [place].

## 2011-05-21 NOTE — Progress Notes (Signed)
Addended by: Hassan Rowan on: 05/21/2011 04:16 PM   Modules accepted: Orders

## 2011-05-25 ENCOUNTER — Encounter (HOSPITAL_COMMUNITY): Payer: Self-pay | Admitting: *Deleted

## 2011-05-25 ENCOUNTER — Emergency Department (HOSPITAL_COMMUNITY)
Admission: EM | Admit: 2011-05-25 | Discharge: 2011-05-25 | Disposition: A | Discharge status: Home / Self Care | Payer: Self-pay | Attending: Emergency Medicine | Admitting: Emergency Medicine

## 2011-05-25 DIAGNOSIS — E119 Type 2 diabetes mellitus without complications: Secondary | ICD-10-CM | POA: Insufficient documentation

## 2011-05-25 DIAGNOSIS — H538 Other visual disturbances: Secondary | ICD-10-CM | POA: Insufficient documentation

## 2011-05-25 DIAGNOSIS — H5710 Ocular pain, unspecified eye: Secondary | ICD-10-CM | POA: Insufficient documentation

## 2011-05-25 DIAGNOSIS — Z794 Long term (current) use of insulin: Secondary | ICD-10-CM | POA: Insufficient documentation

## 2011-05-25 DIAGNOSIS — I10 Essential (primary) hypertension: Secondary | ICD-10-CM | POA: Insufficient documentation

## 2011-05-25 DIAGNOSIS — Z79899 Other long term (current) drug therapy: Secondary | ICD-10-CM | POA: Insufficient documentation

## 2011-05-25 DIAGNOSIS — H547 Unspecified visual loss: Secondary | ICD-10-CM | POA: Insufficient documentation

## 2011-05-25 HISTORY — DX: Essential (primary) hypertension: I10

## 2011-05-25 MED ORDER — TETRACAINE HCL 0.5 % OP SOLN
1.0000 [drp] | Freq: Once | OPHTHALMIC | Status: AC
  Administered 2011-05-25: 1 [drp] via OPHTHALMIC
  Filled 2011-05-25: qty 2

## 2011-05-25 NOTE — ED Provider Notes (Signed)
History     CSN: [id number] Arrival date & time: 05/25/2011  4:50 PM   First MD Initiated Contact with Patient 05/25/11 1920      Chief Complaint  Patient presents with  . Loss of Vision    (Consider location/radiation/quality/duration/timing/severity/associated sxs/prior treatment) Patient is a 55 y.o. male presenting with eye problem. The history is provided by the patient.  Eye Problem  This is a chronic problem. Episode onset: worsening over many years. The problem occurs constantly. The problem has not changed since onset.There is pain in the right eye. There was no injury mechanism. The patient is experiencing no pain. There is no history of trauma to the eye. There is no known exposure to pink eye. He does not wear contacts. Associated symptoms include blurred vision and decreased vision. Pertinent negatives include no discharge, no foreign body sensation, no eye redness, no nausea and no vomiting. He has tried nothing for the symptoms.    Past Medical History  Diagnosis Date  . Diabetes mellitus   . Hypertension     History reviewed. No pertinent past surgical history.  No family history on file.  History  Substance Use Topics  . Smoking status: Former Games developer  . Smokeless tobacco: Not on file  . Alcohol Use: Yes      Review of Systems  Constitutional: Negative for fever.  Eyes: Positive for blurred vision. Negative for pain, discharge, redness and itching.  Respiratory: Negative for cough and shortness of breath.   Cardiovascular: Negative for chest pain.  Gastrointestinal: Negative for nausea, vomiting, abdominal pain and diarrhea.  Neurological: Negative for headaches.  All other systems reviewed and are negative.    Allergies  Review of patient's allergies indicates no known allergies.  Home Medications   Current Outpatient Rx  Name Route Sig Dispense Refill  . ALLOPURINOL 300 MG PO TABS Oral Take 1 tablet (300 mg total) by mouth every other day. 30  tablet 4  . OMEGA-3 FATTY ACIDS 1000 MG PO CAPS Oral Take 2 g by mouth daily.      Marland Kitchen GARLIC 500 MG PO TABS Oral Take 1 tablet by mouth daily.      . INSULIN GLARGINE 100 UNIT/ML [place] SOLN Subcutaneous Inject 100 Units into the skin daily.      Marland Kitchen LISINOPRIL-HYDROCHLOROTHIAZIDE 20-25 MG PO TABS Oral Take 1 tablet by mouth daily. 30 tablet 4  . METFORMIN HCL 1000 MG PO TABS Oral Take 1 tablet (1,000 mg total) by mouth 2 (two) times daily with a meal. 60 tablet 4  . THERA M PLUS PO TABS Oral Take 1 tablet by mouth daily.      Marland Kitchen PRAVASTATIN SODIUM 40 MG PO TABS Oral Take 1 tablet (40 mg total) by mouth daily. 30 tablet 4  . SITAGLIPTIN PHOSPHATE 100 MG PO TABS Oral Take 100 mg by mouth daily.        BP 117/73  Pulse 74  Temp(Src) 98.3 F (36.8 C) (Oral)  Resp 18  SpO2 99%  Physical Exam  Nursing note and vitals reviewed. Constitutional: He is oriented to person, place, and time. He appears well-developed and well-nourished. No distress.  HENT:  Head: Normocephalic and atraumatic.  Eyes: EOM are normal. Pupils are equal, round, and reactive to light.       tono pressures less than 10 in both eyes  Cardiovascular: Normal rate, regular rhythm and normal heart sounds.   Pulmonary/Chest: Effort normal and breath sounds normal. No respiratory distress.  Abdominal: Soft.  He exhibits no distension. There is no tenderness.  Musculoskeletal: Normal range of motion.  Neurological: He is alert and oriented to person, place, and time.  Skin: Skin is warm and dry.  Psychiatric: He has a normal mood and affect.    ED Course  Procedures (including critical care time)  Labs Reviewed - No data to display No results found.   No diagnosis found.    MDM  7:29 PM Pt seen and examined. Pt with chronic decrease in vision of right eye. He is trying to be evaulated by someone who can tell him there is something that can be corrected or if he is going to live with the rest of his life. Eye pressure is  not elevated in either eye. No recent change in vision concerning for acute worsening of ongoing issues. Will recommend he see any optometrist for a full eye exam as they might cost less than a formal ophthalmology eval.        Daleen Bo 05/26/11 0042

## 2011-05-25 NOTE — ED Notes (Signed)
He has been a diabetic for 20 years and for the past months he has been having vision problems for weeks.  He was seen in [place] last week for the same.  He has lost the vision in his rt eye for several weeks

## 2011-05-25 NOTE — ED Provider Notes (Signed)
I saw and evaluated the patient, reviewed the resident's note and I agree with the findings and plan.  Patient is a long-standing diabetic currently does not have any primary care has had trouble with vision and that seemed med [facility] for this a few days ago and was referred to an eye physician patient came here for further evaluation stating that not able to afford an ophthalmologist. Gen. I. exam in ED including pressures were normal but due to his diabetes is good chance it may be some retinal problems patient given referral to some optometrists in his local area and also the possibility of going to back to side clinic. Patient will explore trying to get seen by an optometrist locally and will return for any new or worse symptoms.  Shelda Jakes, MD 05/25/11 2037

## 2011-07-21 ENCOUNTER — Other Ambulatory Visit: Payer: Self-pay | Admitting: *Deleted

## 2011-07-21 MED ORDER — INSULIN GLARGINE 100 UNIT/ML ~~LOC~~ SOLN: 100.0000 [IU] | Freq: Every day | SUBCUTANEOUS | Status: DC

## 2011-07-21 MED ORDER — SITAGLIPTIN PHOSPHATE 100 MG PO TABS: 100.0000 mg | ORAL_TABLET | Freq: Every day | ORAL | Status: DC

## 2011-08-06 ENCOUNTER — Telehealth: Payer: Self-pay | Admitting: *Deleted

## 2011-08-06 NOTE — Telephone Encounter (Signed)
Patient has been referred in the past to various diabetic treatment programs. I realize he has horrible diabetes no insurance no funds and multiple medical problems. He is he requesting [place] to [facility] and they're going to take over his care which is fine by me or is it for a particular diabetic program that I am unaware of that they require referral for. To the best of my knowledge he does not need to be managed by [place] and [facility] at the same time.

## 2011-08-06 NOTE — Telephone Encounter (Signed)
Pt states that he is trying to get help with meds and diabetic care and would like to know if you can refer him to [facility] at [phone]. States that he called [facility] and they told him that he would need to be referred to them. Pt states he has no insurance.

## 2011-08-07 NOTE — Telephone Encounter (Signed)
Pt states that he needs you to refer to them b/c he has no insurance and that they will take care of his medical needs but states they told him that he needs you to refer him. Please advise

## 2011-08-11 NOTE — Telephone Encounter (Signed)
No problem please make the referral but this will be for them to take care of him for primary care and we will send the notes that we have and we wish him well but we will terminate our relationship because he does not need 2 PCP managing his care.  Any thing further from [place] will require him to reestablish himself (with the provider accepting)with this office.

## 2011-08-11 NOTE — Telephone Encounter (Signed)
Called [facility] to make referral but the lady there states they don't accept referrals from any provider and [place] is not one they accept from. States the pt needs to find PCP that can refer to them. I have informed pt that he needs to contact them in regards to a PCP and call us back to send his records when he has another PCP. Informed pt that if he left this clinic that he would have to be re-established providing the provider accepts if he ever wants to return here

## 2011-10-14 ENCOUNTER — Other Ambulatory Visit: Payer: Self-pay | Admitting: *Deleted

## 2011-10-14 DIAGNOSIS — I10 Essential (primary) hypertension: Secondary | ICD-10-CM

## 2011-10-14 MED ORDER — LISINOPRIL-HYDROCHLOROTHIAZIDE 20-25 MG PO TABS: 1.0000 | ORAL_TABLET | Freq: Every day | ORAL | Status: DC

## 2012-02-26 ENCOUNTER — Other Ambulatory Visit: Payer: Self-pay | Admitting: Family Medicine

## 2012-04-20 ENCOUNTER — Other Ambulatory Visit: Payer: Self-pay | Admitting: Family Medicine

## 2012-04-20 NOTE — Telephone Encounter (Signed)
Must make appt

## 2012-04-23 ENCOUNTER — Encounter (HOSPITAL_COMMUNITY): Payer: Self-pay | Admitting: Emergency Medicine

## 2012-04-23 ENCOUNTER — Emergency Department (HOSPITAL_COMMUNITY)
Admission: EM | Admit: 2012-04-23 | Discharge: 2012-04-23 | Disposition: A | Discharge status: Home / Self Care | Payer: Self-pay | Attending: Emergency Medicine | Admitting: Emergency Medicine

## 2012-04-23 DIAGNOSIS — E7800 Pure hypercholesterolemia, unspecified: Secondary | ICD-10-CM | POA: Insufficient documentation

## 2012-04-23 DIAGNOSIS — I10 Essential (primary) hypertension: Secondary | ICD-10-CM | POA: Insufficient documentation

## 2012-04-23 DIAGNOSIS — Z79899 Other long term (current) drug therapy: Secondary | ICD-10-CM | POA: Insufficient documentation

## 2012-04-23 DIAGNOSIS — E119 Type 2 diabetes mellitus without complications: Secondary | ICD-10-CM | POA: Insufficient documentation

## 2012-04-23 DIAGNOSIS — R21 Rash and other nonspecific skin eruption: Secondary | ICD-10-CM | POA: Insufficient documentation

## 2012-04-23 DIAGNOSIS — M109 Gout, unspecified: Secondary | ICD-10-CM | POA: Insufficient documentation

## 2012-04-23 DIAGNOSIS — Z207 Contact with and (suspected) exposure to pediculosis, acariasis and other infestations: Secondary | ICD-10-CM

## 2012-04-23 DIAGNOSIS — Z2089 Contact with and (suspected) exposure to other communicable diseases: Secondary | ICD-10-CM | POA: Insufficient documentation

## 2012-04-23 HISTORY — DX: Gout, unspecified: M10.9

## 2012-04-23 HISTORY — DX: Pure hypercholesterolemia, unspecified: E78.00

## 2012-04-23 MED ORDER — PERMETHRIN 5 % EX CREA: TOPICAL_CREAM | CUTANEOUS | Status: DC

## 2012-04-23 NOTE — ED Provider Notes (Signed)
History     CSN: [id number]  Arrival date & time 04/23/12  1028   First MD Initiated Contact with Patient 04/23/12 1122      Chief Complaint  Patient presents with  . Rash    (Consider location/radiation/quality/duration/timing/severity/associated sxs/prior treatment) HPI Comments: Patient is a 56 year old male that presents emergency department complaining of rash.  Patient states that his sisters family was diagnosed with scabies and treated and her symptoms resolved.  He was evaluated for similar and diagnosed with scabies as well.  He completed the permethrin cream treatment a week and half ago, but states that he is still having extreme pruritus at night primarily in the groin and hands.  When asked if he cleaned his home thoroughly patient reports that he was unaware he was supposed to be doing this.  No other complaints at this time.  Patient is a 56 y.o. male presenting with rash. The history is provided by the patient.  Rash  This is a new problem.    Past Medical History  Diagnosis Date  . Diabetes mellitus   . Hypertension   . Gout   . Hypercholesteremia     History reviewed. No pertinent past surgical history.  History reviewed. No pertinent family history.  History  Substance Use Topics  . Smoking status: Former Games developer  . Smokeless tobacco: Not on file  . Alcohol Use: Yes      Review of Systems  Skin: Positive for rash.  All other systems reviewed and are negative.    Allergies  Review of patient's allergies indicates no known allergies.  Home Medications   Current Outpatient Rx  Name Route Sig Dispense Refill  . METFORMIN HCL 1000 MG PO TABS Oral Take 1,000 mg by mouth 2 (two) times daily with a meal.    . ALLOPURINOL 300 MG PO TABS Oral Take 1 tablet (300 mg total) by mouth every other day. 30 tablet 4  . OMEGA-3 FATTY ACIDS 1000 MG PO CAPS Oral Take 2 g by mouth daily.      Marland Kitchen GARLIC 500 MG PO TABS Oral Take 1 tablet by mouth daily.      .  INSULIN GLARGINE 100 UNIT/ML [place] SOLN Subcutaneous Inject 100 Units into the skin daily. 10 mL 6  . LISINOPRIL-HYDROCHLOROTHIAZIDE 20-25 MG PO TABS  TAKE ONE TABLET BY MOUTH EVERY DAY 30 tablet 0    MUST MAKE APPOINTMENT  . THERA M PLUS PO TABS Oral Take 1 tablet by mouth daily.      Marland Kitchen PRAVASTATIN SODIUM 40 MG PO TABS Oral Take 1 tablet (40 mg total) by mouth daily. 30 tablet 4  . SITAGLIPTIN PHOSPHATE 100 MG PO TABS Oral Take 1 tablet (100 mg total) by mouth daily. 30 tablet 6    BP 151/93  Pulse 94  Temp 98.6 F (37 C) (Oral)  Resp 18  SpO2 99%  Physical Exam  Nursing note and vitals reviewed. Constitutional: He is oriented to person, place, and time. He appears well-developed and well-nourished. No distress.  HENT:  Head: Normocephalic and atraumatic.  Eyes: Conjunctivae normal and EOM are normal.  Neck: Normal range of motion.  Pulmonary/Chest: Effort normal.  Musculoskeletal: Normal range of motion.  Neurological: He is alert and oriented to person, place, and time.  Skin: Skin is warm and dry. Rash noted. He is not diaphoretic.       excurations & black linear dots in groin & upper extremity flexor surfaces.   Psychiatric: He has a  normal mood and affect. His behavior is normal.    ED Course  Procedures (including critical care time)  Labs Reviewed - No data to display No results found.   No diagnosis found.    MDM  Pt w scabies exposure presents with persistent rash after permethrin treatment. advised to followup with her primary care doctor 2 weeks after treatment.  They have also been advised to clean entire household including washing sheets and using R.I.D. spray in the car and on sofa.   The use of permethrin cream was discussed as well          Jaci Carrel, PA-C 04/23/12 1129

## 2012-04-23 NOTE — Progress Notes (Signed)
   CARE MANAGEMENT NOTE 04/23/2012  Patient:  Earl Harvey, Earl Harvey   Account Number:  [id number]  Date Initiated:  04/23/2012  Documentation initiated by:  [facility]  Subjective/Objective Assessment:   scabies     Action/Plan:   med assistance   Anticipated DC Date:  04/23/2012   Anticipated DC Plan:  HOME/SELF CARE      DC Planning Services  CM consult  Medication Assistance      Choice offered to / List presented to:             Status of service:  Completed, signed off Medicare Important Message given?   (If response is "NO", the following Medicare IM given date fields will be blank) Date Medicare IM given:   Date Additional Medicare IM given:    Discharge Disposition:  HOME/SELF CARE  Per UR Regulation:    If discussed at Long Length of Stay Meetings, dates discussed:    Comments:  04/23/2012 1200 Call received from ED, stating pt was d/c from ED and he told RN that he could not afford his medication. NCM contacted main pharmacy and pt is eligible for [facility] med assistance fund. Contacted pt d/c ED RN and explain to send Rx to main pharmacy. Pt was d/c and waiting in holding area for assistance. ED RN will go pick up from main pharmacy when ready. Isidoro Donning RN CCM Case Mgmt phone [phone]

## 2012-04-23 NOTE — ED Notes (Signed)
Pt sts rash and bites x several weeks that pt thinks could be scabies

## 2012-04-24 NOTE — ED Provider Notes (Signed)
Medical screening examination/treatment/procedure(s) were performed by non-physician practitioner and as supervising physician I was immediately available for consultation/collaboration.   Joya Gaskins, MD 04/24/12 [phone]

## 2012-06-22 ENCOUNTER — Emergency Department (HOSPITAL_COMMUNITY)
Admission: EM | Admit: 2012-06-22 | Discharge: 2012-06-22 | Disposition: A | Discharge status: Home / Self Care | Payer: Self-pay

## 2012-09-19 ENCOUNTER — Emergency Department (HOSPITAL_COMMUNITY)
Admission: EM | Admit: 2012-09-19 | Discharge: 2012-09-19 | Disposition: A | Discharge status: Home / Self Care | Payer: 59 | Attending: Emergency Medicine | Admitting: Emergency Medicine

## 2012-09-19 ENCOUNTER — Encounter (HOSPITAL_COMMUNITY): Payer: Self-pay | Admitting: Family Medicine

## 2012-09-19 DIAGNOSIS — M545 Low back pain, unspecified: Secondary | ICD-10-CM | POA: Insufficient documentation

## 2012-09-19 DIAGNOSIS — M109 Gout, unspecified: Secondary | ICD-10-CM | POA: Insufficient documentation

## 2012-09-19 DIAGNOSIS — Z79899 Other long term (current) drug therapy: Secondary | ICD-10-CM | POA: Insufficient documentation

## 2012-09-19 DIAGNOSIS — Z87891 Personal history of nicotine dependence: Secondary | ICD-10-CM | POA: Insufficient documentation

## 2012-09-19 DIAGNOSIS — E119 Type 2 diabetes mellitus without complications: Secondary | ICD-10-CM | POA: Insufficient documentation

## 2012-09-19 DIAGNOSIS — Z8719 Personal history of other diseases of the digestive system: Secondary | ICD-10-CM | POA: Insufficient documentation

## 2012-09-19 DIAGNOSIS — M549 Dorsalgia, unspecified: Secondary | ICD-10-CM

## 2012-09-19 DIAGNOSIS — E7800 Pure hypercholesterolemia, unspecified: Secondary | ICD-10-CM | POA: Insufficient documentation

## 2012-09-19 DIAGNOSIS — I10 Essential (primary) hypertension: Secondary | ICD-10-CM | POA: Insufficient documentation

## 2012-09-19 DIAGNOSIS — Z794 Long term (current) use of insulin: Secondary | ICD-10-CM | POA: Insufficient documentation

## 2012-09-19 HISTORY — DX: Diverticulitis of intestine, part unspecified, without perforation or abscess without bleeding: K57.92

## 2012-09-19 MED ORDER — HYDROCODONE-ACETAMINOPHEN 5-325 MG PO TABS: 1.0000 | ORAL_TABLET | ORAL | Status: DC | PRN

## 2012-09-19 MED ORDER — IBUPROFEN 800 MG PO TABS: 800.0000 mg | ORAL_TABLET | Freq: Three times a day (TID) | ORAL | Status: AC

## 2012-09-19 MED ORDER — CYCLOBENZAPRINE HCL 10 MG PO TABS: 10.0000 mg | ORAL_TABLET | Freq: Two times a day (BID) | ORAL | Status: DC | PRN

## 2012-09-19 NOTE — ED Notes (Signed)
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

## 2012-09-19 NOTE — ED Provider Notes (Signed)
History    This chart was scribed for non-physician practitioner Elpidio Anis, PA-C working with Gilda Crease, MD by Gerlean Ren, ED Scribe. This patient was seen in room WTR7/WTR7 and the patient's care was started at 10:01 PM.    CSN: [id number]  Arrival date & time 09/19/12  2008   First MD Initiated Contact with Patient 09/19/12 2141      Chief Complaint  Patient presents with  . Back Pain     The history is provided by the patient. No language interpreter was used.  Earl Harvey is a 57 y.o. male with h/o DM, HTN, gout who presents to the Emergency Department complaining of lower back pain that has been gradually worsening over the past several months while he has been working as a Development worker, international aid numerous deliveries daily.  Pt reports the pain will suddenly grab him and is worsened when ambulating.  No associated injuries.  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  Pt has never seen orthopedist for this pain or any prior pains.   PCP at [facility]. Past Medical History  Diagnosis Date  . Diabetes mellitus   . Hypertension   . Gout   . Hypercholesteremia   . Diverticulitis     Past Surgical History  Procedure Laterality Date  . Cervical discectomy      No family history on file.  History  Substance Use Topics  . Smoking status: Former Games developer  . Smokeless tobacco: Not on file  . Alcohol Use: Yes     Comment: Occasionally      Review of Systems  Musculoskeletal: Positive for back pain.  Skin: Negative for wound.  All other systems reviewed and are negative.    Allergies  Review of patient's allergies indicates no known allergies.  Home Medications   Current Outpatient Rx  Name  Route  Sig  Dispense  Refill  . allopurinol (ZYLOPRIM) 300 MG tablet   Oral   Take 1 tablet (300 mg total) by mouth every other day.   30 tablet   4   . fish oil-omega-3 fatty acids 1000 MG capsule   Oral   Take 2 g by mouth  daily.           . Garlic 500 MG TABS   Oral   Take 1 tablet by mouth daily.           . insulin glargine (LANTUS) 100 UNIT/ML injection   Subcutaneous   Inject 100 Units into the skin every morning.         Marland Kitchen lisinopril-hydrochlorothiazide (PRINZIDE,ZESTORETIC) 20-25 MG per tablet   Oral   Take 1 tablet by mouth daily.         Marland Kitchen lovastatin (MEVACOR) 20 MG tablet   Oral   Take 20 mg by mouth every other day.         . metFORMIN (GLUCOPHAGE) 1000 MG tablet   Oral   Take 1,000 mg by mouth 2 (two) times daily with a meal.         . Multiple Vitamins-Minerals (MULTIVITAMINS THER. W/MINERALS) TABS   Oral   Take 1 tablet by mouth daily.             BP 161/87  Pulse 85  Temp(Src) 98.4 F (36.9 C) (Oral)  Resp 20  Ht 5\' 9"  (1.753 m)  Wt 230 lb (104.327 kg)  BMI 33.95 kg/m2  SpO2 98%  Physical Exam  Nursing note and  vitals reviewed. Constitutional: He is oriented to person, place, and time. He appears well-developed and well-nourished. No distress.  HENT:  Head: Normocephalic and atraumatic.  Eyes: EOM are normal.  Neck: Neck supple. No tracheal deviation present.  Cardiovascular: Normal rate.   Pulmonary/Chest: Effort normal. No respiratory distress.  Musculoskeletal: Normal range of motion.  Lower back is non-tender without swelling  Neurological: He is alert and oriented to person, place, and time.  Hyporeflexic but equal  Skin: Skin is warm and dry.  Psychiatric: He has a normal mood and affect. His behavior is normal.    ED Course  Procedures (including critical care time) DIAGNOSTIC STUDIES: Oxygen Saturation is 98% on room air, normal by my interpretation.    COORDINATION OF CARE: 10:05 PM- Patient informed of clinical course, understands medical decision-making process, and agrees with plan.  Labs Reviewed - No data to display No results found.   No diagnosis found.  1. Muscular low back pain  MDM  Uncomplicated back pain that  follows muscular pattern. No neurologic abnormalities. I personally performed the services described in this documentation, which was scribed in my presence. The recorded information has been reviewed and is accurate.          Junius Finner, PA-C 09/19/12 2208

## 2012-09-19 NOTE — ED Provider Notes (Signed)
Medical screening examination/treatment/procedure(s) were performed by non-physician practitioner and as supervising physician I was immediately available for consultation/collaboration.  Gilda Crease, MD 09/19/12 [phone]

## 2014-01-29 LAB — HM COLONOSCOPY

## 2014-02-01 ENCOUNTER — Encounter: Payer: Self-pay | Admitting: Internal Medicine

## 2019-07-16 ENCOUNTER — Other Ambulatory Visit: Payer: Self-pay

## 2019-07-16 ENCOUNTER — Ambulatory Visit
Admission: EM | Admit: 2019-07-16 | Discharge: 2019-07-16 | Disposition: A | Discharge status: Home / Self Care | Payer: BC Managed Care – PPO

## 2019-07-16 DIAGNOSIS — Z20822 Contact with and (suspected) exposure to covid-19: Secondary | ICD-10-CM

## 2019-07-16 DIAGNOSIS — I10 Essential (primary) hypertension: Secondary | ICD-10-CM | POA: Diagnosis not present

## 2019-07-16 NOTE — Discharge Instructions (Signed)
COVID testing ordered. As discussed, given recent exposure without symptoms, you may still be in incubation period. Monitor for any symptoms such as cough, congestion, shortness of breath, loss of taste/smell, fever, to start self quarantine and may need retesting. Go to the emergency department for further evaluation if you develop significant shortness of breath, cannot speak in full sentences.   To make appointment: You can text "COVID" to [phone] OR log on to [email or web address] If no smart phone or PC, you can call [phone] for assistance in setting up appointments.  COVID drive through sites:  [place]: [address] : [address] : [address] 

## 2019-07-16 NOTE — ED Triage Notes (Signed)
Pt states his daughter is covid positive last Wednesday, states has been with her prior to that. Requesting test, denies sx's

## 2019-07-16 NOTE — ED Provider Notes (Signed)
[facility]    CSN: [id number] Arrival date & time: 07/16/19  1058      History   Chief Complaint Chief Complaint  Patient presents with  . covid test    HPI Earl Harvey is a 64 y.o. male.   64 year old male with history of DM, HLD, HTN comes in for COVID testing after positive exposure 6 days ago. Daughter tested positive for COVID 5 days ago. Patient remains asymptomatic. Denies URI symptoms such as cough, congestion, sore throat. Denies fever, chills, body aches. Denies abdominal pain, nausea, vomiting, diarrhea. Denies shortness of breath, loss of taste/smell. No antipyretics in the last 8 hours.       Past Medical History:  Diagnosis Date  . Diabetes mellitus   . Diverticulitis   . Gout   . Hypercholesteremia   . Hypertension     Patient Active Problem List   Diagnosis Date Noted  . Gout   . Hypercholesteremia   . DM (diabetes mellitus) (HCC) 05/19/2011  . Hyperlipidemia 05/19/2011  . Hypertension 05/19/2011  . Gout 05/19/2011  . Visual impairment 05/19/2011  . 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 GOUT, UNSPECIFIED 03/28/2008  . OBESITY 03/28/2008  . HYPERTENSION 10/15/2006  . BACK PAIN 10/15/2006  . Type II or unspecified type diabetes mellitus without mention of complication, not stated as uncontrolled 09/02/2006  . HYPERTRIGLYCERIDEMIA 09/02/2006  . HYPERLIPIDEMIA 09/02/2006  . BACK PAIN, LOW 09/02/2006  . TOBACCO USE, QUIT 09/02/2006    Past Surgical History:  Procedure Laterality Date  . CERVICAL DISCECTOMY         Home Medications    Prior to Admission medications   Medication Sig Start Date End Date Taking? Authorizing Provider  glimepiride (AMARYL) 2 MG tablet Take 2 mg by mouth daily with breakfast.   Yes [provider]  allopurinol (ZYLOPRIM) 300 MG tablet Take 1 tablet (300 mg total) by mouth every other day. 05/19/11   Hassan Rowan, MD  fish oil-omega-3 fatty  acids 1000 MG capsule Take 2 g by mouth daily.      [provider]  Garlic 500 MG TABS Take 1 tablet by mouth daily.      [provider]  ibuprofen (ADVIL,MOTRIN) 800 MG tablet Take 1 tablet (800 mg total) by mouth 3 (three) times daily. 09/19/12   Elpidio Anis, PA-C  insulin glargine (LANTUS) 100 UNIT/ML injection Inject 100 Units into the skin every morning. 07/21/11   Hassan Rowan, MD  lisinopril-hydrochlorothiazide (PRINZIDE,ZESTORETIC) 20-25 MG per tablet Take 1 tablet by mouth daily.    [provider]  lovastatin (MEVACOR) 20 MG tablet Take 20 mg by mouth every other day.    [provider]  metFORMIN (GLUCOPHAGE) 1000 MG tablet Take 1,000 mg by mouth 2 (two) times daily with a meal.    [provider]  Multiple Vitamins-Minerals (MULTIVITAMINS THER. W/MINERALS) TABS Take 1 tablet by mouth daily.      [provider]    Family History History reviewed. No pertinent family history.  Social History Social History   Tobacco Use  . Smoking status: Former Games developer  . Smokeless tobacco: Never Used  Substance Use Topics  . Alcohol use: Not Currently    Comment: Occasionally  . Drug use: No     Allergies   Patient has no known allergies.   Review of Systems Review of Systems  Reason unable to perform ROS: See HPI as above.  Physical Exam Triage Vital Signs ED Triage Vitals  Enc Vitals Group     BP 07/16/19 1229 (!) 158/92     Pulse Rate 07/16/19 1229 93     Resp 07/16/19 1229 16     Temp 07/16/19 1229 98.3 F (36.8 C)     Temp Source 07/16/19 1229 Oral     SpO2 07/16/19 1229 95 %     Weight --      Height --      Head Circumference --      Peak Flow --      Pain Score 07/16/19 1231 0     Pain Loc --      Pain Edu? --      Excl. in [place]? --    No data found.  Updated Vital Signs BP (!) 158/92 (BP Location: Left Arm)   Pulse 93   Temp 98.3 F (36.8 C) (Oral)   Resp 16   SpO2 95%   Physical Exam  Constitutional:      General: He is not in acute distress.    Appearance: Normal appearance. He is not ill-appearing, toxic-appearing or diaphoretic.  HENT:     Head: Normocephalic and atraumatic.     Mouth/Throat:     Mouth: Mucous membranes are moist.     Pharynx: Oropharynx is clear. Uvula midline.  Cardiovascular:     Rate and Rhythm: Normal rate and regular rhythm.     Heart sounds: Normal heart sounds. No murmur. No friction rub. No gallop.   Pulmonary:     Effort: Pulmonary effort is normal. No accessory muscle usage, prolonged expiration, respiratory distress or retractions.     Comments: Lungs clear to auscultation without adventitious lung sounds. Musculoskeletal:     Cervical back: Normal range of motion and neck supple.  Neurological:     General: No focal deficit present.     Mental Status: He is alert and oriented to person, place, and time.    UC Treatments / Results  Labs (all labs ordered are listed, but only abnormal results are displayed) Labs Reviewed  NOVEL CORONAVIRUS, NAA    EKG   Radiology No results found.  Procedures Procedures (including critical care time)  Medications Ordered in UC Medications - No data to display  Initial Impression / Assessment and Plan / UC Course  I have reviewed the triage vital signs and the nursing notes.  Pertinent labs & imaging results that were available during my care of the patient were reviewed by me and considered in my medical decision making (see chart for details).    Discussed with patient, given positive exposure without symptoms, could still be within incubation period. If develop symptoms, may need retesting.  Patient expresses understanding and would like to proceed with testing.  COVID testing ordered.  Patient will continue to monitor symptoms.  To self quarantine if develop any symptoms.  Return precautions given.  Final Clinical Impressions(s) / UC Diagnoses   Final diagnoses:  Exposure to  COVID-19 virus   ED Prescriptions    None     PDMP not reviewed this encounter.   Ok Edwards, PA-C 07/16/19 1251

## 2019-07-17 ENCOUNTER — Telehealth: Payer: Self-pay | Admitting: Emergency Medicine

## 2019-07-17 LAB — NOVEL CORONAVIRUS, NAA: SARS-CoV-2, NAA: NOT DETECTED

## 2019-07-17 NOTE — Telephone Encounter (Signed)
Patient called to receive COVID results.  Confirmed identity using two identifiers and provided negative result.  Questions answered.  Patient verbalized understanding.

## 2022-01-04 ENCOUNTER — Encounter (HOSPITAL_COMMUNITY): Payer: Self-pay

## 2022-01-04 ENCOUNTER — Emergency Department (HOSPITAL_COMMUNITY): Payer: Self-pay

## 2022-01-04 ENCOUNTER — Other Ambulatory Visit: Payer: Self-pay

## 2022-01-04 ENCOUNTER — Emergency Department (HOSPITAL_COMMUNITY)
Admission: EM | Admit: 2022-01-04 | Discharge: 2022-01-04 | Disposition: A | Discharge status: Home / Self Care | Payer: Self-pay | Attending: Emergency Medicine | Admitting: Emergency Medicine

## 2022-01-04 DIAGNOSIS — R079 Chest pain, unspecified: Secondary | ICD-10-CM | POA: Insufficient documentation

## 2022-01-04 DIAGNOSIS — Z7901 Long term (current) use of anticoagulants: Secondary | ICD-10-CM | POA: Insufficient documentation

## 2022-01-04 DIAGNOSIS — Z79899 Other long term (current) drug therapy: Secondary | ICD-10-CM | POA: Insufficient documentation

## 2022-01-04 DIAGNOSIS — Z7984 Long term (current) use of oral hypoglycemic drugs: Secondary | ICD-10-CM | POA: Insufficient documentation

## 2022-01-04 DIAGNOSIS — D649 Anemia, unspecified: Secondary | ICD-10-CM | POA: Insufficient documentation

## 2022-01-04 DIAGNOSIS — E1165 Type 2 diabetes mellitus with hyperglycemia: Secondary | ICD-10-CM | POA: Insufficient documentation

## 2022-01-04 DIAGNOSIS — I10 Essential (primary) hypertension: Secondary | ICD-10-CM | POA: Insufficient documentation

## 2022-01-04 DIAGNOSIS — Z794 Long term (current) use of insulin: Secondary | ICD-10-CM | POA: Insufficient documentation

## 2022-01-04 LAB — CBC
HCT: 34.1 % — ABNORMAL LOW (ref 39.0–52.0)
Hemoglobin: 11.5 g/dL — ABNORMAL LOW (ref 13.0–17.0)
MCH: 30.5 pg (ref 26.0–34.0)
MCHC: 33.7 g/dL (ref 30.0–36.0)
MCV: 90.5 fL (ref 80.0–100.0)
Platelets: 247 10*3/uL (ref 150–400)
RBC: 3.77 MIL/uL — ABNORMAL LOW (ref 4.22–5.81)
RDW: 13.1 % (ref 11.5–15.5)
WBC: 7.3 10*3/uL (ref 4.0–10.5)
nRBC: 0 % (ref 0.0–0.2)

## 2022-01-04 LAB — BASIC METABOLIC PANEL
Anion gap: 7 (ref 5–15)
BUN: 26 mg/dL — ABNORMAL HIGH (ref 8–23)
CO2: 26 mmol/L (ref 22–32)
Calcium: 9.7 mg/dL (ref 8.9–10.3)
Chloride: 105 mmol/L (ref 98–111)
Creatinine, Ser: 1.49 mg/dL — ABNORMAL HIGH (ref 0.61–1.24)
GFR, Estimated: 52 mL/min — ABNORMAL LOW (ref >60)
Glucose, Bld: 245 mg/dL — ABNORMAL HIGH (ref 70–99)
Potassium: 3.9 mmol/L (ref 3.5–5.1)
Sodium: 138 mmol/L (ref 135–145)

## 2022-01-04 LAB — TROPONIN I (HIGH SENSITIVITY)
Troponin I (High Sensitivity): 5 ng/L (ref <18)
Troponin I (High Sensitivity): 6 ng/L (ref <18)

## 2022-01-04 LAB — CBG MONITORING, ED: Glucose-Capillary: 180 mg/dL — ABNORMAL HIGH (ref 70–99)

## 2022-01-04 NOTE — Discharge Instructions (Addendum)
Continue taking the Eliquis.  Follow-up with your primary next week for reevaluation.  Work-up today was reassuring, return to the ED if you have new or different pain.

## 2022-01-04 NOTE — ED Triage Notes (Signed)
Pt dx with a DVT on the LLE in May and was started on blood thinners. Pt here for a re-evaluation because he is having some "fluttering" in his R calf.

## 2022-01-04 NOTE — ED Notes (Signed)
Pt reports that he had some chest pains yesterday- like 10-20 times, sharp pains; moving pt to another room to put on ecg monitor

## 2022-01-04 NOTE — ED Provider Notes (Signed)
[facility] EMERGENCY DEPARTMENT Provider Note   CSN: [id number] Arrival date & time: 01/04/22  1143     History  Chief Complaint  Patient presents with   Follow-up    Earl Harvey is a 66 y.o. male.  HPI  Patient with medical history of hypertension, hyperlipidemia, type 2 diabetes on insulin presents today due to chest pain.  Patient is on Eliquis due to blood clot in left lower extremity.  He felt like he is having "fluttering" in his calfs bilaterally.  Not having any pain in the right calf currently, he has been on Eliquis since May and taking it as scheduled without any missed doses.  Yesterday while he was driving he had 20 episodes of localized chest pain which was sharp and resolved within seconds.  Unable to identify provoking features, time was alleviating features.  No relationship with inspiration, denies any shortness of breath, nausea, vomiting.  Home Medications Prior to Admission medications   Medication Sig Start Date End Date Taking? Authorizing Provider  allopurinol (ZYLOPRIM) 300 MG tablet Take 1 tablet (300 mg total) by mouth every other day. 05/19/11   Hassan Rowan, MD  fish oil-omega-3 fatty acids 1000 MG capsule Take 2 g by mouth daily.      [provider]  Garlic 500 MG TABS Take 1 tablet by mouth daily.      [provider]  glimepiride (AMARYL) 2 MG tablet Take 2 mg by mouth daily with breakfast.    [provider]  ibuprofen (ADVIL,MOTRIN) 800 MG tablet Take 1 tablet (800 mg total) by mouth 3 (three) times daily. 09/19/12   Elpidio Anis, PA-C  insulin glargine (LANTUS) 100 UNIT/ML injection Inject 100 Units into the skin every morning. 07/21/11   Hassan Rowan, MD  lisinopril-hydrochlorothiazide (PRINZIDE,ZESTORETIC) 20-25 MG per tablet Take 1 tablet by mouth daily.    [provider]  lovastatin (MEVACOR) 20 MG tablet Take 20 mg by mouth every other day.    [provider]  metFORMIN (GLUCOPHAGE) 1000 MG tablet  Take 1,000 mg by mouth 2 (two) times daily with a meal.    [provider]  Multiple Vitamins-Minerals (MULTIVITAMINS THER. W/MINERALS) TABS Take 1 tablet by mouth daily.      [provider]      Allergies    Patient has no known allergies.    Review of Systems   Review of Systems  Physical Exam Updated Vital Signs BP (!) 150/90   Pulse 82   Temp 98.3 F (36.8 C) (Oral)   Resp 19   Ht 5\' 10"  (1.778 m)   Wt 104.3 kg   SpO2 98%   BMI 33.00 kg/m  Physical Exam Vitals and nursing note reviewed. Exam conducted with a chaperone present.  Constitutional:      Appearance: Normal appearance.  HENT:     Head: Normocephalic and atraumatic.  Eyes:     General: No scleral icterus.       Right eye: No discharge.        Left eye: No discharge.     Extraocular Movements: Extraocular movements intact.     Pupils: Pupils are equal, round, and reactive to light.  Cardiovascular:     Rate and Rhythm: Normal rate and regular rhythm.     Pulses: Normal pulses.     Heart sounds: Normal heart sounds. No murmur heard.    No friction rub. No gallop.  Pulmonary:     Effort: Pulmonary effort is normal.  No respiratory distress.     Breath sounds: Normal breath sounds.  Abdominal:     General: Abdomen is flat. Bowel sounds are normal. There is no distension.     Palpations: Abdomen is soft.     Tenderness: There is no abdominal tenderness.  Musculoskeletal:     Right lower leg: No edema.     Left lower leg: No edema.     Comments: Mild calf tenderness to the left lower extremity, right lower extremity is without any calf tenderness posteriorly to knee or to the posterior calf.  Right lower extremities roughly symmetric bilaterally  Skin:    General: Skin is warm and dry.     Coloration: Skin is not jaundiced.  Neurological:     Mental Status: He is alert. Mental status is at baseline.     Coordination: Coordination normal.     ED Results / Procedures / Treatments    Labs (all labs ordered are listed, but only abnormal results are displayed) Labs Reviewed  BASIC METABOLIC PANEL - Abnormal; Notable for the following components:      Result Value   Glucose, Bld 245 (*)    BUN 26 (*)    Creatinine, Ser 1.49 (*)    GFR, Estimated 52 (*)    All other components within normal limits  CBC - Abnormal; Notable for the following components:   RBC 3.77 (*)    Hemoglobin 11.5 (*)    HCT 34.1 (*)    All other components within normal limits  CBG MONITORING, ED - Abnormal; Notable for the following components:   Glucose-Capillary 180 (*)    All other components within normal limits  TROPONIN I (HIGH SENSITIVITY)  TROPONIN I (HIGH SENSITIVITY)    EKG None  Radiology DG Chest 2 View  Result Date: 01/04/2022 CLINICAL DATA:  Chest pain beginning yesterday. Recent lower extremity DVT. EXAM: CHEST - 2 VIEW COMPARISON:  01/28/2006 FINDINGS: The heart size and mediastinal contours are within normal limits. Both lungs are clear. The visualized skeletal structures are unremarkable. IMPRESSION: No active cardiopulmonary disease. Electronically Signed   By: Danae Orleans M.D.   On: 01/04/2022 13:05    Procedures Procedures    Medications Ordered in ED Medications - No data to display  ED Course/ Medical Decision Making/ A&P                           Medical Decision Making Amount and/or Complexity of Data Reviewed Labs: ordered. Radiology: ordered.   Patient presents due to chest pain.  Differential includes but not limited to ACS, pneumonia, pericarditis, esophageal rupture, aortic dissection.  On exam his lungs are clear to auscultation bilaterally, S1-S2 without any murmurs, rubs or gallops appreciated.  His lower extremities roughly symmetric bilaterally, he does endorse bilateral leg cramps but given he is on Eliquis as of his knee dose I do not have a high suspicion for new DVT.  Considered PE but patient is anticoagulated on Eliquis with no missed  doses, he is not hypoxic, chest pain does not sound pleuritic in nature.  I ordered and reviewed laboratory work-up.  Patient is mildly anemic with a hemoglobin 11.5, slightly hyperglycemic at 245 but no evidence of underlying DKA.  Slight increase in creatinine at 1.49, negative delta troponin.  EKG shows right bundle branch block but no ischemic findings.  Chest x-ray without any underlying pneumonia, widened mediastinum, acute processes.  Patient has been chest pain-free for  over 24 hours with negative delta troponin so I do not think this is ACS.  I have put in an ambulatory referral for cardiology to help arrange outpatient follow-up given patient's age and risk factors for ACS.  Also put an ultrasound ordered of right lower extremity given ultrasound is not available at this facility today at patient request although I think is unlikely patient has a DVT given he is anticoagulated.   Return precautions discussed, discharged in stable condition.        Final Clinical Impression(s) / ED Diagnoses Final diagnoses:  Chest pain, unspecified type    Rx / DC Orders ED Discharge Orders          Ordered    US Venous Img Lower Unilateral Right        01/04/22 1530    Ambulatory referral to Cardiology       Comments: If you have not heard from the Cardiology office within the next 72 hours please call [phone].   01/04/22 1532              Theron Arista, PA-C 01/04/22 1533    Derwood Kaplan, MD 01/05/22 [phone]

## 2022-05-29 DIAGNOSIS — M79672 Pain in left foot: Secondary | ICD-10-CM | POA: Insufficient documentation

## 2022-05-29 HISTORY — DX: Pain in left foot: M79.672

## 2022-07-30 DIAGNOSIS — R21 Rash and other nonspecific skin eruption: Secondary | ICD-10-CM

## 2022-07-30 HISTORY — DX: Rash and other nonspecific skin eruption: R21

## 2022-08-26 LAB — HEMOGLOBIN A1C: Hemoglobin A1C: 8.6

## 2022-10-01 ENCOUNTER — Ambulatory Visit (INDEPENDENT_AMBULATORY_CARE_PROVIDER_SITE_OTHER): Payer: 59 | Admitting: Family Medicine

## 2022-10-01 ENCOUNTER — Encounter: Payer: Self-pay | Admitting: Family Medicine

## 2022-10-01 VITALS — BP 125/72 | HR 90 | Ht 70.0 in | Wt 219.0 lb

## 2022-10-01 DIAGNOSIS — Z6831 Body mass index (BMI) 31.0-31.9, adult: Secondary | ICD-10-CM

## 2022-10-01 DIAGNOSIS — M1A30X0 Chronic gout due to renal impairment, unspecified site, without tophus (tophi): Secondary | ICD-10-CM

## 2022-10-01 DIAGNOSIS — N1831 Chronic kidney disease, stage 3a: Secondary | ICD-10-CM | POA: Diagnosis not present

## 2022-10-01 DIAGNOSIS — E1165 Type 2 diabetes mellitus with hyperglycemia: Secondary | ICD-10-CM | POA: Diagnosis not present

## 2022-10-01 DIAGNOSIS — E039 Hypothyroidism, unspecified: Secondary | ICD-10-CM

## 2022-10-01 DIAGNOSIS — E1169 Type 2 diabetes mellitus with other specified complication: Secondary | ICD-10-CM | POA: Diagnosis not present

## 2022-10-01 DIAGNOSIS — I824Y2 Acute embolism and thrombosis of unspecified deep veins of left proximal lower extremity: Secondary | ICD-10-CM | POA: Insufficient documentation

## 2022-10-01 DIAGNOSIS — E6609 Other obesity due to excess calories: Secondary | ICD-10-CM

## 2022-10-01 DIAGNOSIS — E1159 Type 2 diabetes mellitus with other circulatory complications: Secondary | ICD-10-CM

## 2022-10-01 DIAGNOSIS — Z794 Long term (current) use of insulin: Secondary | ICD-10-CM

## 2022-10-01 DIAGNOSIS — I152 Hypertension secondary to endocrine disorders: Secondary | ICD-10-CM

## 2022-10-01 DIAGNOSIS — E785 Hyperlipidemia, unspecified: Secondary | ICD-10-CM

## 2022-10-01 NOTE — Progress Notes (Unsigned)
New Patient Office Visit  Subjective    Patient ID: Earl Harvey, male    DOB: Oct 15, 1955  Age: 67 y.o. MRN: [id number]  CC:  Chief Complaint  Patient presents with  . Establish Care    Needed new pcp close to home. Needs new endocrinologist    HPI Earl Harvey presents to establish care ***   History of gout in feet and ankles. He takes allopurinol daily and has not had a flare in many years.   He has a DVT in his left leg. Found last year. Has repeat imaging a few weeks ago but this is still there, but better. On eliqus.   Sees vein speci;ist in [place] on [address].    He is has declined a statin. He was previously on atorvatation but stopped taking this. He is afarid of side effects. Just started on zetia.   He has been walking and has lost some weight in the last few week.   Outpatient Encounter Medications as of 10/01/2022  Medication Sig  . allopurinol (ZYLOPRIM) 300 MG tablet Take 1 tablet (300 mg total) by mouth every other day.  . fish oil-omega-3 fatty acids 1000 MG capsule Take 2 g by mouth daily.    . Garlic [id number] MG TABS Take 1 tablet by mouth daily.    Marland Kitchen glimepiride (AMARYL) 2 MG tablet Take 2 mg by mouth daily with breakfast.  . ibuprofen (ADVIL,MOTRIN) 800 MG tablet Take 1 tablet (800 mg total) by mouth 3 (three) times daily.  . insulin glargine (LANTUS) 100 UNIT/ML injection Inject 100 Units into the skin every morning.  . Multiple Vitamins-Minerals (MULTIVITAMINS THER. W/MINERALS) TABS Take 1 tablet by mouth daily.    . [DISCONTINUED] lisinopril-hydrochlorothiazide (PRINZIDE,ZESTORETIC) 20-25 MG per tablet Take 1 tablet by mouth daily.  . [DISCONTINUED] lovastatin (MEVACOR) 20 MG tablet Take 20 mg by mouth every other day.  . [DISCONTINUED] metFORMIN (GLUCOPHAGE) 1000 MG tablet Take 1,000 mg by mouth 2 (two) times daily with a meal.   No facility-administered encounter medications on file as of 10/01/2022.    Past Medical History:  Diagnosis Date  .  Diabetes mellitus   . Diverticulitis   . Gout   . Hypercholesteremia   . Hypertension     Past Surgical History:  Procedure Laterality Date  . CERVICAL DISCECTOMY      No family history on file.  Social History   Socioeconomic History  . Marital status: Divorced    Spouse name: Not on file  . Number of children: Not on file  . Years of education: Not on file  . Highest education level: Not on file  Occupational History  . Not on file  Tobacco Use  . Smoking status: Former  . Smokeless tobacco: Never  Vaping Use  . Vaping Use: Never used  Substance and Sexual Activity  . Alcohol use: Not Currently    Comment: Occasionally  . Drug use: No  . Sexual activity: Not Currently  Other Topics Concern  . Not on file  Social History Narrative  . Not on file   Social Determinants of Health   Financial Resource Strain: Not on file  Food Insecurity: Not on file  Transportation Needs: Not on file  Physical Activity: Not on file  Stress: Not on file  Social Connections: Not on file  Intimate Partner Violence: Not on file    ROS      Objective    BP 125/72   Pulse 90  Ht 5\' 10"  (1.778 m)   Wt 219 lb (99.3 kg)   SpO2 96%   BMI 31.42 kg/m   Physical Exam  {Labs (Optional):23779}    Assessment & Plan:   Problem List Items Addressed This Visit   None   No follow-ups on file.   Earl Perking, FNP

## 2022-10-03 LAB — MICROALBUMIN / CREATININE URINE RATIO
Creatinine, Urine: 45.1 mg/dL
Microalb/Creat Ratio: 71 mg/g creat — ABNORMAL HIGH (ref 0–29)
Microalbumin, Urine: 32.2 ug/mL

## 2022-10-13 ENCOUNTER — Telehealth: Payer: Self-pay | Admitting: Family Medicine

## 2022-10-29 ENCOUNTER — Encounter: Payer: Self-pay | Admitting: "Endocrinology

## 2022-11-02 ENCOUNTER — Other Ambulatory Visit: Payer: Self-pay | Admitting: Family Medicine

## 2022-11-02 MED ORDER — LEVOTHYROXINE SODIUM 125 MCG PO TABS
125.0000 ug | ORAL_TABLET | Freq: Every morning | ORAL | 3 refills | Status: DC
Start: 2022-11-02 — End: 2023-10-19

## 2022-11-02 MED ORDER — APIXABAN 5 MG PO TABS: 5.0000 mg | ORAL_TABLET | Freq: Two times a day (BID) | ORAL | 2 refills | Status: DC

## 2022-11-02 NOTE — Telephone Encounter (Signed)
  Prescription Request  11/02/2022  Is this a "Controlled Substance" medicine?   Have you seen your PCP in the last 2 weeks? LOV 3/28  If YES, route message to pool  -  If NO, patient needs to be scheduled for appointment.  What is the name of the medication or equipment? apixaban (ELIQUIS) 5 MG TABS tablet and levothyroxine (SYNTHROID) 125 MCG tablet  Have you contacted your pharmacy to request a refill? Yes    Which pharmacy would you like this sent to?  [facility] [address], [place] - [place] [address]     *Patient said these medications were discussed at his appt with PCP*   Patient notified that their request is being sent to the clinical staff for review and that they should receive a response within 2 business days.

## 2022-11-03 NOTE — Telephone Encounter (Signed)
Pt aware refills sent to pharmacy 

## 2022-12-30 ENCOUNTER — Encounter: Payer: Self-pay | Admitting: "Endocrinology

## 2022-12-30 ENCOUNTER — Ambulatory Visit (INDEPENDENT_AMBULATORY_CARE_PROVIDER_SITE_OTHER): Payer: 59 | Admitting: "Endocrinology

## 2022-12-30 VITALS — BP 116/62 | HR 84 | Ht 70.0 in | Wt 219.0 lb

## 2022-12-30 DIAGNOSIS — Z794 Long term (current) use of insulin: Secondary | ICD-10-CM

## 2022-12-30 DIAGNOSIS — I10 Essential (primary) hypertension: Secondary | ICD-10-CM | POA: Diagnosis not present

## 2022-12-30 DIAGNOSIS — E782 Mixed hyperlipidemia: Secondary | ICD-10-CM

## 2022-12-30 DIAGNOSIS — E1169 Type 2 diabetes mellitus with other specified complication: Secondary | ICD-10-CM | POA: Diagnosis not present

## 2022-12-30 LAB — POCT GLYCOSYLATED HEMOGLOBIN (HGB A1C): HbA1c, POC (controlled diabetic range): 7.9 % — AB (ref 0.0–7.0)

## 2022-12-30 NOTE — Patient Instructions (Signed)

## 2022-12-30 NOTE — Progress Notes (Signed)
Endocrinology Consult Note       12/30/2022, 4:13 PM   Subjective:    Patient ID: Earl Harvey, male    DOB: Jul 24, 1955.  Earl Harvey is being seen in consultation for management of currently uncontrolled symptomatic diabetes requested by  Gabriel Earing, FNP.   Past Medical History:  Diagnosis Date   Diabetes mellitus    Diverticulitis    Gout    Hypercholesteremia    Hypertension    Kidney disease     Past Surgical History:  Procedure Laterality Date   CERVICAL DISCECTOMY      Social History   Socioeconomic History   Marital status: Divorced    Spouse name: Not on file   Number of children: Not on file   Years of education: Not on file   Highest education level: Not on file  Occupational History   Not on file  Tobacco Use   Smoking status: Former   Smokeless tobacco: Never  Vaping Use   Vaping Use: Never used  Substance and Sexual Activity   Alcohol use: Not Currently    Comment: Occasionally   Drug use: No   Sexual activity: Not Currently  Other Topics Concern   Not on file  Social History Narrative   Not on file   Social Determinants of Health   Financial Resource Strain: Not on file  Food Insecurity: Not on file  Transportation Needs: Not on file  Physical Activity: Not on file  Stress: Not on file  Social Connections: Not on file    Family History  Problem Relation Age of Onset   Stroke Father    Heart failure Father     Outpatient Encounter Medications as of 12/30/2022  Medication Sig   aspirin EC 81 MG tablet Take 81 mg by mouth daily. Swallow whole.   Cholecalciferol (VITAMIN D3) 125 MCG (5000 UT) TABS Take 1 tablet by mouth daily.   Turmeric (QC TUMERIC COMPLEX PO) Take by mouth daily.   allopurinol (ZYLOPRIM) 300 MG tablet Take 1 tablet (300 mg total) by mouth every other day.   apixaban (ELIQUIS) 5 MG TABS tablet Take 1 tablet (5 mg total) by mouth  2 (two) times daily.   chlorthalidone (HYGROTON) 25 MG tablet Take 1 tablet by mouth daily.   empagliflozin (JARDIANCE) 10 MG TABS tablet Take 10 mg by mouth daily.   ezetimibe (ZETIA) 10 MG tablet Take 10 mg by mouth daily.   fish oil-omega-3 fatty acids 1000 MG capsule Take 2 g by mouth daily.     Garlic 500 MG TABS Take 1 tablet by mouth daily.     ibuprofen (ADVIL,MOTRIN) 800 MG tablet Take 1 tablet (800 mg total) by mouth 3 (three) times daily.   insulin glargine (LANTUS) 100 UNIT/ML injection Inject 80 Units into the skin at bedtime.   levothyroxine (SYNTHROID) 125 MCG tablet Take 1 tablet (125 mcg total) by mouth every morning.   losartan (COZAAR) 50 MG tablet Take 50 mg by mouth daily.   Multiple Vitamins-Minerals (MULTIVITAMINS THER. W/MINERALS) TABS Take 1 tablet by mouth daily.     [DISCONTINUED] glimepiride (AMARYL)  2 MG tablet Take 2 mg by mouth 2 (two) times daily.   No facility-administered encounter medications on file as of 12/30/2022.    ALLERGIES: No Known Allergies  VACCINATION STATUS: Immunization History  Administered Date(s) Administered   Influenza Whole 03/28/2008    Diabetes He presents for his initial diabetic visit. He has type 2 diabetes mellitus. Onset time: He was diagnosed at approximate age of 33 years.. His disease course has been fluctuating. There are no hypoglycemic associated symptoms. Pertinent negatives for hypoglycemia include no confusion, headaches, pallor or seizures. There are no diabetic associated symptoms. Pertinent negatives for diabetes include no chest pain, no fatigue, no polydipsia, no polyphagia, no polyuria and no weakness. There are no hypoglycemic complications. Symptoms are improving. Diabetic complications include nephropathy. Risk factors for coronary artery disease include dyslipidemia, diabetes mellitus, family history, hypertension, male sex, sedentary lifestyle and obesity. Current diabetic treatment includes insulin injections  (He is currently on Lantus 100 units nightly, glimepiride milligrams p.o. twice daily, Jardiance 10 daily.). His weight is decreasing steadily. He is following a generally unhealthy diet. When asked about meal planning, he reported none. He has not had a previous visit with a dietitian. He rarely participates in exercise. His home blood glucose trend is decreasing steadily. His overall blood glucose range is 180-200 mg/dl. (He did not bring his meter nor logs to review.  He is plan of care was 7.9% today improving from 8.6%.  He reports that his blood glucose readings slowly improving.  He denies hypoglycemia.) An ACE inhibitor/angiotensin II receptor blocker is being taken.  Hyperlipidemia This is a chronic problem. The current episode started more than 1 year ago. The problem is uncontrolled. Exacerbating diseases include chronic renal disease, diabetes and obesity. Pertinent negatives include no chest pain, myalgias or shortness of breath. Risk factors for coronary artery disease include diabetes mellitus, hypertension, family history, obesity, male sex and dyslipidemia.  Hypertension This is a chronic problem. The current episode started more than 1 year ago. Pertinent negatives include no chest pain, headaches, neck pain, palpitations or shortness of breath. Risk factors for coronary artery disease include dyslipidemia, diabetes mellitus, male gender, obesity, sedentary lifestyle and family history. Past treatments include angiotensin blockers. Hypertensive end-organ damage includes kidney disease. Identifiable causes of hypertension include chronic renal disease.     Review of Systems  Constitutional:  Negative for chills, fatigue, fever and unexpected weight change.  HENT:  Negative for dental problem, mouth sores and trouble swallowing.   Eyes:  Negative for visual disturbance.  Respiratory:  Negative for cough, choking, chest tightness, shortness of breath and wheezing.   Cardiovascular:   Negative for chest pain, palpitations and leg swelling.  Gastrointestinal:  Negative for abdominal distention, abdominal pain, constipation, diarrhea, nausea and vomiting.  Endocrine: Negative for polydipsia, polyphagia and polyuria.  Genitourinary:  Negative for dysuria, flank pain, hematuria and urgency.  Musculoskeletal:  Negative for back pain, gait problem, myalgias and neck pain.  Skin:  Negative for pallor, rash and wound.  Neurological:  Negative for seizures, syncope, weakness, numbness and headaches.  Psychiatric/Behavioral:  Negative for confusion and dysphoric mood.     Objective:       12/30/2022    1:17 PM 10/01/2022    3:07 PM 10/01/2022    3:06 PM  Vitals with BMI  Height 5\' 10"   5\' 10"   Weight 219 lbs  219 lbs  BMI 31.42  31.42  Systolic 116 125 829  Diastolic 62 72 80  Pulse 84  90    BP 116/62   Pulse 84   Ht 5\' 10"  (1.778 m)   Wt 219 lb (99.3 kg)   BMI 31.42 kg/m   Wt Readings from Last 3 Encounters:  12/30/22 219 lb (99.3 kg)  10/01/22 219 lb (99.3 kg)  01/04/22 230 lb (104.3 kg)     Physical Exam Constitutional:      General: He is not in acute distress.    Appearance: He is well-developed.  HENT:     Head: Normocephalic and atraumatic.  Neck:     Thyroid: No thyromegaly.     Trachea: No tracheal deviation.  Cardiovascular:     Rate and Rhythm: Normal rate.     Pulses:          Dorsalis pedis pulses are 1+ on the right side and 1+ on the left side.       Posterior tibial pulses are 1+ on the right side and 1+ on the left side.     Heart sounds: Normal heart sounds, S1 normal and S2 normal. No murmur heard.    No gallop.  Pulmonary:     Effort: Pulmonary effort is normal. No respiratory distress.     Breath sounds: Normal breath sounds. No wheezing.  Abdominal:     General: Bowel sounds are normal. There is no distension.     Palpations: Abdomen is soft.     Tenderness: There is no abdominal tenderness. There is no guarding.   Musculoskeletal:     Right shoulder: No swelling or deformity.     Cervical back: Normal range of motion and neck supple.  Skin:    General: Skin is warm and dry.     Findings: No rash.     Nails: There is no clubbing.  Neurological:     Mental Status: He is alert and oriented to person, place, and time.     Cranial Nerves: No cranial nerve deficit.     Sensory: No sensory deficit.     Gait: Gait normal.     Deep Tendon Reflexes: Reflexes are normal and symmetric.  Psychiatric:        Speech: Speech normal.        Behavior: Behavior normal. Behavior is cooperative.        Thought Content: Thought content normal.        Judgment: Judgment normal.       CMP ( most recent) CMP     Component Value Date/Time   NA 138 01/04/2022 1303   K 3.9 01/04/2022 1303   CL 105 01/04/2022 1303   CO2 26 01/04/2022 1303   GLUCOSE 245 (H) 01/04/2022 1303   BUN 26 (H) 01/04/2022 1303   CREATININE 1.49 (H) 01/04/2022 1303   CALCIUM 9.7 01/04/2022 1303   PROT 7.8 09/11/2008 2028   ALBUMIN 4.8 09/11/2008 2028   AST 25 09/11/2008 2028   ALT 39 09/11/2008 2028   ALKPHOS 57 09/11/2008 2028   BILITOT 0.4 09/11/2008 2028   GFRNONAA 52 (L) 01/04/2022 1303     Diabetic Labs (most recent): Lab Results  Component Value Date   HGBA1C 7.9 (A) 12/30/2022   HGBA1C 8.6 08/26/2022   HGBA1C 8.7 05/19/2011     Lipid Panel ( most recent) Lipid Panel     Component Value Date/Time   CHOL 155 09/11/2008 2028   TRIG 208 (H) 09/11/2008 2028   HDL 36 (L) 09/11/2008 2028   CHOLHDL 4.3 Ratio 09/11/2008 2028   VLDL 42 (H)  09/11/2008 2028   LDLCALC 77 09/11/2008 2028        Assessment & Plan:   1. Type 2 diabetes mellitus with other specified complication, with long-term current use of insulin (HCC)   - Earl Harvey has currently uncontrolled symptomatic type 2 DM since  67 years of age. He did not bring his meter nor logs to review.  He is plan of care was 7.9% today improving from 8.6%.  He  reports that his blood glucose readings slowly improving.  He denies hypoglycemia. -Recent labs reviewed. - I had a long discussion with him about the possible risk factors and  the pathology behind its diabetes and its complications. -his diabetes is complicated by obesity/sedentary life, pretension/hyperlipidemia, CKD and he remains at a high risk for more acute and chronic complications which include CAD, CVA, CKD, retinopathy, and neuropathy. These are all discussed in detail with him.  - I discussed all available options of managing his diabetes including de-escalation of medications. I have counseled him on Food as Medicine by adopting a Whole Food , Plant Predominant  ( WFPP) nutrition as recommended by [employer] of Lifestyle Medicine. Patient is encouraged to switch to  unprocessed or minimally processed  complex starch, adequate protein intake (mainly plant source), minimal liquid fat, plenty of fruits, and vegetables. -  he is advised to stick to a routine mealtimes to eat 3 complete meals a day and snack only when necessary ( to snack only to correct hypoglycemia BG <70 day time or <100 at night).   - he acknowledges that there is a room for improvement in his food and drink choices. - Further Specific Suggestion is made for him to avoid simple carbohydrates  from his diet including Cakes, Sweet Desserts, Ice Cream, Soda (diet and regular), Sweet Tea, Candies, Chips, Cookies, Store Bought Juices, Alcohol ,  Artificial Sweeteners,  Coffee Creamer, and "Sugar-free" Products. This will help patient to have more stable blood glucose profile and potentially avoid unintended weight gain.  The following Lifestyle Medicine recommendations according to American College of Lifestyle Medicine [facility]) were discussed and offered to patient and he agrees to start the journey:  A.  Whole Foods, Plant-based plate comprising of fruits and vegetables, plant-based proteins, whole-grain carbohydrates was  discussed in detail with the patient.   A list for source of those nutrients were also provided to the patient.  Patient will use only water or unsweetened tea for hydration. B.  The need to stay away from risky substances including alcohol, smoking; obtaining 7 to 9 hours of restorative sleep, at least 150 minutes of moderate intensity exercise weekly, the importance of healthy social connections,  and stress reduction techniques were discussed. C.  A full color page of  Calorie density of various food groups per pound showing examples of each food groups was provided to the patient.  - he will be scheduled with Norm Salt, RDN, CDE for individualized diabetes education.  - I have approached him with the following individualized plan to manage  his diabetes and patient agrees:   -In light of his presentation with improved glycemic profile, she will not need prandial insulin for now.  In fact, I discussed and lowered his Lantus to 80 units-associated with monitoring of blood glucose twice a day-daily before breakfast and at bedtime. - he is encouraged to call clinic for blood glucose levels less than 70 or above 200 mg /dl.  He declined CGM prescription for now.  Advised to continue  Jardiance 10 mg p.o. daily at breakfast.  He is advised to discontinue glimepiride.  - he will be considered for incretin therapy as appropriate next visit.  - Specific targets for  A1c;  LDL, HDL,  and Triglycerides were discussed with the patient.  2) Blood Pressure /Hypertension:  his blood pressure is  controlled to target.   he is advised to continue his current medications including losartan 50 mg p.o. daily with breakfast . 3) Lipids/Hyperlipidemia:   Review of his recent lipid panel showed uncontrolled hypertriglyceridemia, controlled LDL of 77.    he  is advised to continue    ezetimibe 10 mg p.o. daily, omega-3 fatty acids. Side effects and precautions discussed with him.  4)  Weight/Diet:  Body mass  index is 31.42 kg/m.  -   clearly complicating his diabetes care.   he is is a candidate for weight loss. I discussed with him the fact that loss of 5 - 10% of his  current body weight will have the most impact on his diabetes management.  The above detailed  ACLM recommendations for nutrition, exercise, sleep, social life, avoidance of risky substances, the need for restorative sleep   information will also detailed on discharge instructions.  5) Chronic Care/Health Maintenance:  -he  is on ARB and Statin medications and  is encouraged to initiate and continue to follow up with Ophthalmology, Dentist,  Podiatrist at least yearly or according to recommendations, and advised to   stay away from smoking. I have recommended yearly flu vaccine and pneumonia vaccine at least every 5 years; moderate intensity exercise for up to 150 minutes weekly; and  sleep for 7- 9 hours a day.  - he is  advised to maintain close follow up with Gabriel Earing, FNP for primary care needs, as well as his other providers for optimal and coordinated care.   Thank you for involving me in the care of this pleasant patient.  I spent  63  minutes in the care of the patient today including review of labs from CMP, Lipids, Thyroid Function, Hematology (current and previous including abstractions from other facilities); face-to-face time discussing  his blood glucose readings/logs, discussing hypoglycemia and hyperglycemia episodes and symptoms, medications doses, his options of short and long term treatment based on the latest standards of care / guidelines;  discussion about incorporating lifestyle medicine;  and documenting the encounter. Risk reduction counseling performed per USPSTF guidelines to reduce  obesity and cardiovascular risk factors.      Please refer to Patient Instructions for Blood Glucose Monitoring and Insulin/Medications Dosing Guide"  in media tab for additional information. Please  also refer to " Patient  Self Inventory" in the Media  tab for reviewed elements of pertinent patient history.  Earl Harvey participated in the discussions, expressed understanding, and voiced agreement with the above plans.  All questions were answered to his satisfaction. he is encouraged to contact clinic should he have any questions or concerns prior to his return visit.   Follow up plan: - Return in about 3 months (around 04/01/2023) for F/U with Pre-visit Labs, Meter/CGM/Logs, A1c here.  Marquis Lunch, MD [facility] [address] [place], [place] [address] Phone: [phone]  Fax: [phone]    12/30/2022, 4:13 PM  This note was partially dictated with voice recognition software. Similar sounding words can be transcribed inadequately or may not  be corrected upon review.

## 2023-01-04 ENCOUNTER — Telehealth: Payer: Self-pay

## 2023-01-04 ENCOUNTER — Encounter: Payer: Self-pay | Admitting: Family Medicine

## 2023-01-04 ENCOUNTER — Ambulatory Visit (INDEPENDENT_AMBULATORY_CARE_PROVIDER_SITE_OTHER): Payer: 59 | Admitting: Family Medicine

## 2023-01-04 VITALS — BP 134/86 | HR 81 | Temp 98.7°F | Ht 70.0 in | Wt 216.0 lb

## 2023-01-04 DIAGNOSIS — R3915 Urgency of urination: Secondary | ICD-10-CM | POA: Diagnosis not present

## 2023-01-04 DIAGNOSIS — E785 Hyperlipidemia, unspecified: Secondary | ICD-10-CM | POA: Diagnosis not present

## 2023-01-04 DIAGNOSIS — R972 Elevated prostate specific antigen [PSA]: Secondary | ICD-10-CM

## 2023-01-04 DIAGNOSIS — I82512 Chronic embolism and thrombosis of left femoral vein: Secondary | ICD-10-CM

## 2023-01-04 DIAGNOSIS — N1831 Chronic kidney disease, stage 3a: Secondary | ICD-10-CM

## 2023-01-04 DIAGNOSIS — Z794 Long term (current) use of insulin: Secondary | ICD-10-CM

## 2023-01-04 DIAGNOSIS — E1159 Type 2 diabetes mellitus with other circulatory complications: Secondary | ICD-10-CM

## 2023-01-04 DIAGNOSIS — R21 Rash and other nonspecific skin eruption: Secondary | ICD-10-CM | POA: Diagnosis not present

## 2023-01-04 DIAGNOSIS — M1A30X0 Chronic gout due to renal impairment, unspecified site, without tophus (tophi): Secondary | ICD-10-CM

## 2023-01-04 DIAGNOSIS — I152 Hypertension secondary to endocrine disorders: Secondary | ICD-10-CM

## 2023-01-04 DIAGNOSIS — E1165 Type 2 diabetes mellitus with hyperglycemia: Secondary | ICD-10-CM | POA: Diagnosis not present

## 2023-01-04 DIAGNOSIS — E039 Hypothyroidism, unspecified: Secondary | ICD-10-CM | POA: Insufficient documentation

## 2023-01-04 DIAGNOSIS — E1169 Type 2 diabetes mellitus with other specified complication: Secondary | ICD-10-CM | POA: Diagnosis not present

## 2023-01-04 DIAGNOSIS — Z7901 Long term (current) use of anticoagulants: Secondary | ICD-10-CM

## 2023-01-04 MED ORDER — EMPAGLIFLOZIN 25 MG PO TABS: 25.0000 mg | ORAL_TABLET | Freq: Every day | ORAL | 3 refills | Status: DC

## 2023-01-04 MED ORDER — PERMETHRIN 5 % EX CREA: TOPICAL_CREAM | CUTANEOUS | 0 refills | Status: DC

## 2023-01-04 NOTE — Progress Notes (Signed)
Established Patient Office Visit  Subjective   Patient ID: Earl Harvey, male    DOB: 16-Feb-1956  Age: 67 y.o. MRN: [id number]  Chief Complaint  Patient presents with   Medical Management of Chronic Issues    HPI  T2DM Managed by endocrinology. Recent A1c was 7.9. He is currently on jardiance, lantus 100 units at bedtime, glimepiride. Saw Dr. Fransico Him who really wants him to make major diet changes and decrease his insulin dosage. He would prefer to follow up here for management of his diabetes rather than continuing to see Dr. Fransico Him.  2. HTN Complaint with meds - Yes Current Medications - losartan Pertinent ROS:  Headache - No Fatigue - No Visual Disturbances - No Chest pain - No Dyspnea - No Palpitations - No LE edema - No  3. Gout No flares in years. On allopurinol daily.   4. Thyroid Compliant with medications - Yes Current medications - synthroid 125 mcg Weight -  stable Bowel habit changes - No Heat or cold intolerance - No Mood changes - No Changes in sleep habits - No Fatigue - No Skin, hair, or nail changes - No Tremor - No Palpitations - No Edema - No Shortness of breath - No  5. DVT Acute DVT of left leg-unprovoked. On eliquis BID. He wears a compression sock on this leg. DVT was still present on repeat US on 07/17/22. He needs a new referral to specialist in this area now.   6. Rash Reports rash to scalp, webbings of fingers, arms, groin for the last few weeks. There was concern that a roommate in the house had bed bugs. They have had an exterminator come out and have clean all linens. Denies seeing any bed bugs. Reports that "bugs have burrowed in his skin. They leave a trail and then come out". Rash is itchy.   7. Elevated PSA Reports hx of elevated PSA. He does have urgency, otherwise denies symptoms. He has not seen urology before.   Past Medical History:  Diagnosis Date   Diabetes mellitus    Diverticulitis    Gout    Hypercholesteremia     Hypertension    Kidney disease       ROS As per HPI.   Objective:     BP 134/86   Pulse 81   Temp 98.7 F (37.1 C)   Ht 5\' 10"  (1.778 m)   Wt 216 lb (98 kg)   SpO2 96%   BMI 30.99 kg/m  BP Readings from Last 3 Encounters:  01/04/23 (!) 150/90  12/30/22 116/62  10/01/22 125/72      Physical Exam Vitals and nursing note reviewed.  Constitutional:      General: He is not in acute distress.    Appearance: He is not ill-appearing, toxic-appearing or diaphoretic.  Neck:     Thyroid: No thyroid mass, thyromegaly or thyroid tenderness.     Vascular: No carotid bruit.  Cardiovascular:     Rate and Rhythm: Normal rate and regular rhythm.     Heart sounds: Normal heart sounds. No murmur heard. Pulmonary:     Effort: Pulmonary effort is normal. No respiratory distress.     Breath sounds: Normal breath sounds.  Abdominal:     General: Bowel sounds are normal. There is no distension.     Palpations: Abdomen is soft.     Tenderness: There is no abdominal tenderness. There is no guarding or rebound.  Musculoskeletal:     Cervical back: Neck  supple. No rigidity.  Skin:    General: Skin is warm and dry.     Findings: Rash (linear rash present between webbings of fingers) present.  Neurological:     General: No focal deficit present.     Mental Status: He is alert and oriented to person, place, and time.  Psychiatric:        Mood and Affect: Mood normal.        Behavior: Behavior normal.        Thought Content: Thought content normal.        Judgment: Judgment normal.      No results found for any visits on 01/04/23.    The 10-year ASCVD risk score (Arnett DK, et al., 2019) is: 43%    Assessment & Plan:   Earl Harvey was seen today for medical management of chronic issues.  Diagnoses and all orders for this visit:  Type 2 diabetes mellitus with hyperglycemia, with long-term current use of insulin (HCC) A1c is not at goal. Report A1c 7.9 recently with endo appt. He  would prefer to no longer see endo. Increase jardaance to 25 mg daily. On zetia, declined statin. On ARB. Foot exam and urine micro are UTD.  -     empagliflozin (JARDIANCE) 25 MG TABS tablet; Take 1 tablet (25 mg total) by mouth daily before breakfast. -     AMB Referral to Pharmacy Medication Management  Hypertension associated with type 2 diabetes mellitus (HCC) Well controlled on current regimen.  -     CBC with Differential/Platelet -     CMP14+EGFR -     AMB Referral to Pharmacy Medication Management  Hyperlipidemia associated with type 2 diabetes mellitus (HCC) Well controlled on current regimen.  -     Lipid panel -     AMB Referral to Pharmacy Medication Management  Stage 3a chronic kidney disease (HCC) -     CBC with Differential/Platelet -     CMP14+EGFR -     AMB Referral to Pharmacy Medication Management  Chronic gout due to renal impairment without tophus, unspecified site On allopurinol.  -     Uric Acid  Acquired hypothyroidism -     TSH -     T4, Free  Chronic deep vein thrombosis (DVT) of femoral vein of left lower extremity (HCC) Current use of long term anticoagulation Unprovoked. On Eliquis. Referral to vascular.  -     Ambulatory referral to Vascular Surgery  Elevated PSA -     PSA, total and free  Urinary urgency -     PSA, total and free  Rash Concern for scabies.  -     permethrin (ELIMITE) 5 % cream; Apply to all areas of the body from the neck to soles of feet. Leave on for 8 to 14 hours before removing by washing (shower or bath). For older adults, also apply on the hairline, neck, scalp, temple, and forehead. Repeat in 14 days.     Return in about 3 months (around 04/06/2023) for chronic follow up.   The patient indicates understanding of these issues and agrees with the plan.  Gabriel Earing, FNP

## 2023-01-04 NOTE — Progress Notes (Signed)
   Care Guide Note  01/04/2023 Name: Earl Harvey MRN: [id number] DOB: [birth year]  Referred by: Gabriel Earing, FNP Reason for referral : Care Coordination (Outreach to schedule with Pharm d /)   Earl Harvey is a 67 y.o. year old male who is a primary care patient of Gabriel Earing, FNP. Login Judie Petit Schnarr was referred to the pharmacist for assistance related to HTN and DM.    Successful contact was made with the patient to discuss pharmacy services including being ready for the pharmacist to call at least 5 minutes before the scheduled appointment time, to have medication bottles and any blood sugar or blood pressure readings ready for review. The patient agreed to meet with the pharmacist via with the pharmacist via telephone visit on (date/time).  01/22/2023  Penne Lash, RMA Care Guide [facility]  [place], [place] [address] Direct Dial: [phone] [email or web address]

## 2023-01-05 ENCOUNTER — Telehealth: Payer: Self-pay | Admitting: Family Medicine

## 2023-01-05 DIAGNOSIS — R21 Rash and other nonspecific skin eruption: Secondary | ICD-10-CM

## 2023-01-05 LAB — CBC WITH DIFFERENTIAL/PLATELET
Basophils Absolute: 0 10*3/uL (ref 0.0–0.2)
Basos: 0 %
EOS (ABSOLUTE): 0.2 10*3/uL (ref 0.0–0.4)
Eos: 2 %
Hematocrit: 42.7 % (ref 37.5–51.0)
Hemoglobin: 14.3 g/dL (ref 13.0–17.7)
Immature Grans (Abs): 0.1 10*3/uL (ref 0.0–0.1)
Immature Granulocytes: 1 %
Lymphocytes Absolute: 2.7 10*3/uL (ref 0.7–3.1)
Lymphs: 24 %
MCH: 30.1 pg (ref 26.6–33.0)
MCHC: 33.5 g/dL (ref 31.5–35.7)
MCV: 90 fL (ref 79–97)
Monocytes Absolute: 0.9 10*3/uL (ref 0.1–0.9)
Monocytes: 8 %
Neutrophils Absolute: 7.3 10*3/uL — ABNORMAL HIGH (ref 1.4–7.0)
Neutrophils: 65 %
Platelets: 290 10*3/uL (ref 150–450)
RBC: 4.75 x10E6/uL (ref 4.14–5.80)
RDW: 12.5 % (ref 11.6–15.4)
WBC: 11.2 10*3/uL — ABNORMAL HIGH (ref 3.4–10.8)

## 2023-01-05 LAB — CMP14+EGFR
ALT: 33 IU/L (ref 0–44)
AST: 25 IU/L (ref 0–40)
Albumin: 4.4 g/dL (ref 3.9–4.9)
Alkaline Phosphatase: 68 IU/L (ref 44–121)
BUN/Creatinine Ratio: 18 (ref 10–24)
BUN: 22 mg/dL (ref 8–27)
Bilirubin Total: <0.2 mg/dL (ref 0.0–1.2)
CO2: 23 mmol/L (ref 20–29)
Calcium: 9.5 mg/dL (ref 8.6–10.2)
Chloride: 100 mmol/L (ref 96–106)
Creatinine, Ser: 1.2 mg/dL (ref 0.76–1.27)
Globulin, Total: 2.8 g/dL (ref 1.5–4.5)
Glucose: 124 mg/dL — ABNORMAL HIGH (ref 70–99)
Potassium: 4.1 mmol/L (ref 3.5–5.2)
Sodium: 139 mmol/L (ref 134–144)
Total Protein: 7.2 g/dL (ref 6.0–8.5)
eGFR: 67 mL/min/{1.73_m2} (ref >59)

## 2023-01-05 LAB — PSA, TOTAL AND FREE
PSA, Free Pct: 36.3 %
PSA, Free: 2.43 ng/mL
Prostate Specific Ag, Serum: 6.7 ng/mL — ABNORMAL HIGH (ref 0.0–4.0)

## 2023-01-05 LAB — LIPID PANEL
Chol/HDL Ratio: 5.8 ratio — ABNORMAL HIGH (ref 0.0–5.0)
Cholesterol, Total: 174 mg/dL (ref 100–199)
HDL: 30 mg/dL — ABNORMAL LOW (ref >39)
LDL Chol Calc (NIH): 91 mg/dL (ref 0–99)
Triglycerides: 321 mg/dL — ABNORMAL HIGH (ref 0–149)
VLDL Cholesterol Cal: 53 mg/dL — ABNORMAL HIGH (ref 5–40)

## 2023-01-05 LAB — T4, FREE: Free T4: 1.47 ng/dL (ref 0.82–1.77)

## 2023-01-05 LAB — URIC ACID: Uric Acid: 6.8 mg/dL (ref 3.8–8.4)

## 2023-01-05 LAB — TSH: TSH: 1.7 u[IU]/mL (ref 0.450–4.500)

## 2023-01-05 NOTE — Telephone Encounter (Signed)
Review of office note- patient was treated with elimite cream-nothing about triamcinolone cream in nmote

## 2023-01-06 ENCOUNTER — Other Ambulatory Visit: Payer: Self-pay

## 2023-01-06 ENCOUNTER — Other Ambulatory Visit: Payer: Self-pay | Admitting: Family Medicine

## 2023-01-06 DIAGNOSIS — R972 Elevated prostate specific antigen [PSA]: Secondary | ICD-10-CM

## 2023-01-06 DIAGNOSIS — D72829 Elevated white blood cell count, unspecified: Secondary | ICD-10-CM

## 2023-01-06 MED ORDER — TRIAMCINOLONE ACETONIDE 0.5 % EX OINT
1.0000 | TOPICAL_OINTMENT | Freq: Two times a day (BID) | CUTANEOUS | 0 refills | Status: AC
Start: 2023-01-06 — End: ?

## 2023-01-06 NOTE — Telephone Encounter (Signed)
Patient notified that rx would be sent in during initial phone call

## 2023-01-06 NOTE — Telephone Encounter (Signed)
Rx sent 

## 2023-01-06 NOTE — Telephone Encounter (Signed)
Patient states that he has used the permethrin cream and he now needs the triamcinolone cream that you all discussed?? He states that he has been using that for a long time but I do not see on med list

## 2023-01-22 ENCOUNTER — Ambulatory Visit (INDEPENDENT_AMBULATORY_CARE_PROVIDER_SITE_OTHER): Payer: 59 | Admitting: Pharmacist

## 2023-01-22 ENCOUNTER — Encounter: Payer: Self-pay | Admitting: Pharmacist

## 2023-01-22 DIAGNOSIS — E785 Hyperlipidemia, unspecified: Secondary | ICD-10-CM

## 2023-01-22 DIAGNOSIS — E1169 Type 2 diabetes mellitus with other specified complication: Secondary | ICD-10-CM

## 2023-01-22 NOTE — Progress Notes (Signed)
01/22/2023 Name: Earl Harvey MRN: [id number] DOB: [birth year]  No chief complaint on file.   Earl Harvey is a 67 y.o. year old male who presented for a telephone visit.   They were referred to the pharmacist by their PCP for assistance in managing diabetes.    Subjective:  Care Team: Primary Care Provider: Gabriel Earing, FNP  Medication Access/Adherence  Current Pharmacy:  [facility] [address] ([address]), [place] - [address] [place] ([place]) [place] [address] Phone: [phone] Fax: [phone]  [facility] [address], [place] - [address] [place] [address] Phone: [phone] Fax: [phone]   Patient reports affordability concerns with their medications: No  Patient reports access/transportation concerns to their pharmacy: No  Patient reports adherence concerns with their medications:  No     Diabetes:  Current medications:  Lantus 100 units daily, Jardiance 25mg  (currently using 10mg  tabs and doubling up to equal 20mg  then will transition to 25mg  tabs) Medications tried in the past: metformin, canagliflozin, sitagliptin  Current glucose readings: 78 this AM FBG Using accuchek guide meter; testing daily  -Patient is injecting insulin daily -He would greatly benefit from a continuous glucose monitoring system (I.e. libre or dexcom)  Patient denies hypoglycemic s/sx including dizziness, shakiness, sweating. Patient denies hyperglycemic symptoms including polyuria, polydipsia, polyphagia, nocturia, neuropathy, blurred vision.  Current meal patterns:  Discussed meal planning options and Plate method for healthy eating Avoid sugary drinks and desserts Incorporate balanced protein, non starchy veggies, 1 serving of carbohydrate with each meal Increase water intake Increase physical activity as able  Current physical activity: encouraged as able  Current medication access support:  medicare/caid   Objective:  Lab Results  Component Value Date   HGBA1C 7.9 (A) 12/30/2022    Lab Results  Component Value Date   CREATININE 1.20 01/04/2023   BUN 22 01/04/2023   NA 139 01/04/2023   K 4.1 01/04/2023   CL 100 01/04/2023   CO2 23 01/04/2023    Lab Results  Component Value Date   CHOL 174 01/04/2023   HDL 30 (L) 01/04/2023   LDLCALC 91 01/04/2023   TRIG 321 (H) 01/04/2023   CHOLHDL 5.8 (H) 01/04/2023    Medications Reviewed Today     Reviewed by Gabriel Earing, FNP (Family Nurse Practitioner) on 01/04/23 at [phone]  Med List Status: <None>   Medication Order Taking? Sig Documenting Provider Last Dose Status Informant  allopurinol (ZYLOPRIM) 300 MG tablet [id number] Yes Take 1 tablet (300 mg total) by mouth every other day. Hassan Rowan, MD Taking Active Self  apixaban (ELIQUIS) 5 MG TABS tablet [id number] Yes Take 1 tablet (5 mg total) by mouth 2 (two) times daily. Gabriel Earing, FNP Taking Active   aspirin EC 81 MG tablet [id number] Yes Take 81 mg by mouth daily. Swallow whole. [provider] Taking Active   chlorthalidone (HYGROTON) 25 MG tablet [id number] Yes Take 1 tablet by mouth daily. [provider] Taking Active   Cholecalciferol (VITAMIN D3) 125 MCG (5000 UT) TABS [id number] Yes Take 1 tablet by mouth daily. [provider] Taking Active   empagliflozin (JARDIANCE) 25 MG TABS tablet [id number] Yes Take 1 tablet (25 mg total) by mouth daily before breakfast. Gabriel Earing, FNP  Active   ezetimibe (ZETIA) 10 MG tablet [id number] Yes Take 10 mg by mouth daily. [provider] Taking Active   fish oil-omega-3 fatty acids 1000 MG  capsule [id number] Yes Take 2 g by mouth daily.   [provider] Taking Active Self  Garlic 500 MG TABS [id number] Yes Take 1 tablet by mouth daily.   [provider] Taking Active Self  ibuprofen (ADVIL,MOTRIN) 800 MG tablet [id number] Yes Take 1 tablet (800 mg total) by mouth 3  (three) times daily. Elpidio Anis, PA-C Taking Active   insulin glargine (LANTUS) 100 UNIT/ML injection [id number] Yes Inject 80 Units into the skin at bedtime. Hassan Rowan, MD Taking Active Self  levothyroxine (SYNTHROID) 125 MCG tablet [id number] Yes Take 1 tablet (125 mcg total) by mouth every morning. Gabriel Earing, FNP Taking Active   losartan (COZAAR) 50 MG tablet [id number] Yes Take 50 mg by mouth daily. [provider] Taking Active   Multiple Vitamins-Minerals (MULTIVITAMINS THER. W/MINERALS) Evelena Asa [id number] Yes Take 1 tablet by mouth daily.   [provider] Taking Active Self  permethrin (ELIMITE) 5 % cream [id number] Yes Apply to all areas of the body from the neck to soles of feet. Leave on for 8 to 14 hours before removing by washing (shower or bath). For older adults, also apply on the hairline, neck, scalp, temple, and forehead. Repeat in 14 days. Gabriel Earing, FNP  Active   Turmeric (QC TUMERIC COMPLEX PO) [id number] Yes Take by mouth daily. [provider] Taking Active              Assessment/Plan:   Diabetes: - Currently uncontrolled, but appears to be improving with the increase in Jardiance - Reviewed long term cardiovascular and renal outcomes of uncontrolled blood sugar - Reviewed goal A1c, goal fasting, and goal 2 hour post prandial glucose - Reviewed dietary modifications including: FOLLOWING A HEART HEALTHY DIET/HEALTHY PLATE METHOD - Reviewed lifestyle modifications including:increase physical activity as able - Recommend to :  CONTINUE LANTUS 100 UNITS DAILY CONTINUE JARDIANCE 25MG  DAILY  - Recommend to check glucose daily (fasting) or if symptomatic -Patient respectfully declines CGM at this time, but states he will let us know if he would like in the future -Patient could also benefit from switching to [place] u200 or [place] u300 due to daily dose of 100units.  He would not like to make any changes today.  Encouraged patient to  continue the great progress!   Follow Up Plan: as needed  Kieth Brightly, PharmD, BCACP Clinical Pharmacist, [facility]

## 2023-02-04 ENCOUNTER — Encounter: Payer: Self-pay | Admitting: Vascular Surgery

## 2023-02-04 ENCOUNTER — Ambulatory Visit (INDEPENDENT_AMBULATORY_CARE_PROVIDER_SITE_OTHER): Payer: 59 | Admitting: Vascular Surgery

## 2023-02-04 ENCOUNTER — Other Ambulatory Visit (HOSPITAL_COMMUNITY): Payer: Self-pay | Admitting: Vascular Surgery

## 2023-02-04 ENCOUNTER — Ambulatory Visit (HOSPITAL_COMMUNITY)
Admission: RE | Admit: 2023-02-04 | Discharge: 2023-02-04 | Disposition: A | Discharge status: Home / Self Care | Payer: 59 | Source: Ambulatory Visit | Attending: Vascular Surgery | Admitting: Vascular Surgery

## 2023-02-04 VITALS — BP 121/73 | HR 75 | Temp 98.6°F | Resp 20 | Ht 70.0 in | Wt 216.0 lb

## 2023-02-04 DIAGNOSIS — Z86718 Personal history of other venous thrombosis and embolism: Secondary | ICD-10-CM

## 2023-02-04 DIAGNOSIS — I825Y2 Chronic embolism and thrombosis of unspecified deep veins of left proximal lower extremity: Secondary | ICD-10-CM | POA: Diagnosis not present

## 2023-02-04 NOTE — Progress Notes (Signed)
ASSESSMENT & PLAN   H/O DVT: This patient had what sounds like a provoked DVT of the left femoral vein in May 2023.  He has been on Eliquis for over 1 year.  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  As he has been on Eliquis for over a year I think would be reasonable to stop this.  I have explained that he will be at high risk for DVT in the future but that there is also risk with lifelong anticoagulation.  He would like to come back in 1 year for follow-up duplex which I think is perfectly reasonable.  We have also discussed the importance of leg elevation, exercise, and compression therapy.  I have explained that I will be retiring so he will be seen on the PA schedule in 1 year.  He knows to call sooner if he develops any new symptoms or worsening leg swelling.  REASON FOR CONSULT:    History of DVT.  The consult is requested by [name], NP  HPI:   Earl Harvey is a 67 y.o. male who had a left femoral DVT reportedly diagnosed in May 2023.  He had been on a 1700 mile trip and has been driving 4 to 5 days without stopping much.  Thus, I suspect this is a provoked DVT.  He had a follow-up study on 07/17/2022 that showed that he still had some clot in the femoral vein.  This is all by report and I do not have these actually studies to review.  The patient denies significant swelling in the left leg and denies significant pain.  He has been on Eliquis since May 2023.  Thus he has been on this for over a year.  He has no family history of clotting disorders that he is aware of.  Past Medical History:  Diagnosis Date   Diabetes mellitus    Diverticulitis    Gout    Hypercholesteremia    Hypertension    Kidney disease     Family History  Problem Relation Age of Onset   Stroke Father    Heart failure Father     SOCIAL HISTORY: Social History   Tobacco Use   Smoking status: Former    Smokeless tobacco: Never  Substance Use Topics   Alcohol use: Not Currently    Comment: Occasionally    No Known Allergies  Current Outpatient Medications  Medication Sig Dispense Refill   allopurinol (ZYLOPRIM) 300 MG tablet Take 1 tablet (300 mg total) by mouth every other day. 30 tablet 4   aspirin EC 81 MG tablet Take 81 mg by mouth daily. Swallow whole.     chlorthalidone (HYGROTON) 25 MG tablet Take 1 tablet by mouth daily.     Cholecalciferol (VITAMIN D3) 125 MCG (5000 UT) TABS Take 1 tablet by mouth daily.     empagliflozin (JARDIANCE) 25 MG TABS tablet Take 1 tablet (25 mg total) by mouth daily before breakfast. 90 tablet 3   ezetimibe (ZETIA) 10 MG tablet Take 10 mg by mouth daily.     fish oil-omega-3 fatty acids 1000 MG capsule Take 2 g by mouth daily.       Garlic 500 MG TABS Take 1 tablet by mouth daily.       ibuprofen (ADVIL,MOTRIN) 800 MG tablet Take 1 tablet (800 mg total) by mouth 3 (three) times daily. 21 tablet 0   insulin glargine (  LANTUS) 100 UNIT/ML injection Inject 100 Units into the skin at bedtime.     levothyroxine (SYNTHROID) 125 MCG tablet Take 1 tablet (125 mcg total) by mouth every morning. 90 tablet 3   losartan (COZAAR) 50 MG tablet Take 50 mg by mouth daily.     Multiple Vitamins-Minerals (MULTIVITAMINS THER. W/MINERALS) TABS Take 1 tablet by mouth daily.       permethrin (ELIMITE) 5 % cream Apply to all areas of the body from the neck to soles of feet. Leave on for 8 to 14 hours before removing by washing (shower or bath). For older adults, also apply on the hairline, neck, scalp, temple, and forehead. Repeat in 14 days. 60 g 0   triamcinolone ointment (KENALOG) 0.5 % Apply 1 Application topically 2 (two) times daily. 30 g 0   Turmeric (QC TUMERIC COMPLEX PO) Take by mouth daily.     apixaban (ELIQUIS) 5 MG TABS tablet Take 1 tablet (5 mg total) by mouth 2 (two) times daily. 60 tablet 2   No current facility-administered medications for this visit.     REVIEW OF SYSTEMS:  [X]  denotes positive finding, [ ]  denotes negative finding Cardiac  Comments:  Chest pain or chest pressure:    Shortness of breath upon exertion:    Short of breath when lying flat:    Irregular heart rhythm:        Vascular    Pain in calf, thigh, or hip brought on by ambulation:    Pain in feet at night that wakes you up from your sleep:     Blood clot in your veins: x   Leg swelling:         Pulmonary    Oxygen at home:    Productive cough:     Wheezing:         Neurologic    Sudden weakness in arms or legs:     Sudden numbness in arms or legs:     Sudden onset of difficulty speaking or slurred speech:    Temporary loss of vision in one eye:     Problems with dizziness:         Gastrointestinal    Blood in stool:     Vomited blood:         Genitourinary    Burning when urinating:     Blood in urine:        Psychiatric    Major depression:         Hematologic    Bleeding problems:    Problems with blood clotting too easily:        Skin    Rashes or ulcers:        Constitutional    Fever or chills:    -  PHYSICAL EXAM:   Vitals:   02/04/23 0833  BP: 121/73  Pulse: 75  Resp: 20  Temp: 98.6 F (37 C)  SpO2: 96%  Weight: 216 lb (98 kg)  Height: 5\' 10"  (1.778 m)   Body mass index is 30.99 kg/m. GENERAL: The patient is a well-nourished male, in no acute distress. The vital signs are documented above. CARDIAC: There is a regular rate and rhythm.  VASCULAR: I do not detect carotid bruits. He has palpable pedal pulses. He has no significant lower extremity swelling. He has no significant hyperpigmentation. PULMONARY: There is good air exchange bilaterally without wheezing or rales. ABDOMEN: Soft and non-tender with normal pitched bowel sounds.  MUSCULOSKELETAL: There are no  major deformities. NEUROLOGIC: No focal weakness or paresthesias are detected. SKIN: There are no ulcers or rashes noted. PSYCHIATRIC: The patient has a  normal affect.  DATA:    VENOUS DUPLEX: This patient was very concerned as he had not had a follow-up study since January and when the study was done last there was still clot in the femoral vein.  Does he wish to have a follow-up study today which I think is perfectly reasonable.  I have independently interpreted that study.  This shows no evidence of acute DVT.  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  Waverly Ferrari [facility]

## 2023-02-05 ENCOUNTER — Other Ambulatory Visit: Payer: Self-pay

## 2023-02-05 ENCOUNTER — Telehealth: Payer: Self-pay | Admitting: Family Medicine

## 2023-02-05 DIAGNOSIS — I825Y2 Chronic embolism and thrombosis of unspecified deep veins of left proximal lower extremity: Secondary | ICD-10-CM

## 2023-02-05 DIAGNOSIS — E1165 Type 2 diabetes mellitus with hyperglycemia: Secondary | ICD-10-CM

## 2023-02-05 NOTE — Telephone Encounter (Signed)
  Prescription Request  02/05/2023  Is this a "Controlled Substance" medicine? no  Have you seen your PCP in the last 2 weeks? no  If YES, route message to pool  -  If NO, patient needs to be scheduled for appointment.  What is the name of the medication or equipment? Glimepiride 2 mg Takes one pill in the morning and one at night Have you contacted your pharmacy to request a refill? NO   Which pharmacy would you like this sent to? [facility]   Patient notified that their request is being sent to the clinical staff for review and that they should receive a response within 2 business days.

## 2023-02-05 NOTE — Telephone Encounter (Signed)
Pt called stating he will not be going back to the Endocrinologist, he wants to stay on the Glimipiride it keeps his sugar down. Please advise.

## 2023-02-05 NOTE — Telephone Encounter (Signed)
NA/VM full, Dr. Fransico Him took pt off this medication at his visit on 12/30/22.

## 2023-02-08 MED ORDER — GLIMEPIRIDE 2 MG PO TABS
2.0000 mg | ORAL_TABLET | Freq: Two times a day (BID) | ORAL | 3 refills | Status: DC
Start: 2023-02-08 — End: 2024-02-08

## 2023-02-08 NOTE — Telephone Encounter (Signed)
Refill sent.

## 2023-02-08 NOTE — Telephone Encounter (Signed)
Patient aware and verbalizes understanding. 

## 2023-02-09 ENCOUNTER — Telehealth: Payer: Self-pay | Admitting: *Deleted

## 2023-02-09 NOTE — Telephone Encounter (Signed)
Returning Mr. Avalo telephone voice message asking about ordering the [facility] compression wedge.  Mr. Luginbill states that he has contacted Medicare and that Medicare will cover the cost  of the [facility] compression wedge.  Spoke with office manager Fabio Neighbors about the process for Mr. Shewchuk to order the compression wedge with Medicare assistance.  Conveyed to Mr. Freda that he needs to contact [facility] at [phone] and place an order with them and ask them to file the paperwork with Medicare.  [facility] will contact [facility] if they need clinical documentation. Mr. Veneman verbalized understanding of the process.

## 2023-02-10 ENCOUNTER — Ambulatory Visit: Payer: 59 | Admitting: Urology

## 2023-02-11 ENCOUNTER — Ambulatory Visit (INDEPENDENT_AMBULATORY_CARE_PROVIDER_SITE_OTHER): Payer: 59 | Admitting: Family Medicine

## 2023-02-11 ENCOUNTER — Encounter: Payer: Self-pay | Admitting: Family Medicine

## 2023-02-11 VITALS — BP 110/56 | HR 82 | Temp 98.2°F | Ht 70.0 in | Wt 220.4 lb

## 2023-02-11 DIAGNOSIS — Z87891 Personal history of nicotine dependence: Secondary | ICD-10-CM

## 2023-02-11 DIAGNOSIS — I4510 Unspecified right bundle-branch block: Secondary | ICD-10-CM

## 2023-02-11 DIAGNOSIS — W57XXXA Bitten or stung by nonvenomous insect and other nonvenomous arthropods, initial encounter: Secondary | ICD-10-CM

## 2023-02-11 DIAGNOSIS — Z23 Encounter for immunization: Secondary | ICD-10-CM

## 2023-02-11 DIAGNOSIS — Z0001 Encounter for general adult medical examination with abnormal findings: Secondary | ICD-10-CM

## 2023-02-11 DIAGNOSIS — Z86718 Personal history of other venous thrombosis and embolism: Secondary | ICD-10-CM

## 2023-02-11 DIAGNOSIS — Z0000 Encounter for general adult medical examination without abnormal findings: Secondary | ICD-10-CM

## 2023-02-11 NOTE — Progress Notes (Signed)
Subjective:   Earl Harvey is a 67 y.o. male who presents for a Welcome to Medicare exam.   Review of Systems: Denies chest pain, shortness of breath, edema, leg pain, fever, weight loss, vomiting, dizziness, palpitations, visual disturbances, or back pain.  Cardiac Risk Factors include: diabetes mellitus;advanced age (>16men, >42 women);hypertension;male gender;obesity (BMI >30kg/m2)     Objective:    Today's Vitals   02/11/23 0948  BP: (!) 110/56  Pulse: 82  Temp: 98.2 F (36.8 C)  TempSrc: Temporal  SpO2: 95%  Weight: 220 lb 6 oz (100 kg)  Height: 5\' 10"  (1.778 m)   Body mass index is 31.62 kg/m.  Medications Outpatient Encounter Medications as of 02/11/2023  Medication Sig   allopurinol (ZYLOPRIM) 300 MG tablet Take 1 tablet (300 mg total) by mouth every other day.   aspirin EC 81 MG tablet Take 81 mg by mouth daily. Swallow whole.   chlorthalidone (HYGROTON) 25 MG tablet Take 1 tablet by mouth daily.   Cholecalciferol (VITAMIN D3) 125 MCG (5000 UT) TABS Take 1 tablet by mouth daily.   empagliflozin (JARDIANCE) 25 MG TABS tablet Take 1 tablet (25 mg total) by mouth daily before breakfast.   ezetimibe (ZETIA) 10 MG tablet Take 10 mg by mouth daily.   fish oil-omega-3 fatty acids 1000 MG capsule Take 2 g by mouth daily.     Garlic 500 MG TABS Take 1 tablet by mouth daily.     glimepiride (AMARYL) 2 MG tablet Take 1 tablet (2 mg total) by mouth 2 (two) times daily with a meal.   ibuprofen (ADVIL,MOTRIN) 800 MG tablet Take 1 tablet (800 mg total) by mouth 3 (three) times daily.   insulin glargine (LANTUS) 100 UNIT/ML injection Inject 100 Units into the skin at bedtime.   levothyroxine (SYNTHROID) 125 MCG tablet Take 1 tablet (125 mcg total) by mouth every morning.   losartan (COZAAR) 50 MG tablet Take 50 mg by mouth daily.   Multiple Vitamins-Minerals (MULTIVITAMINS THER. W/MINERALS) TABS Take 1 tablet by mouth daily.     permethrin (ELIMITE) 5 % cream Apply to all areas  of the body from the neck to soles of feet. Leave on for 8 to 14 hours before removing by washing (shower or bath). For older adults, also apply on the hairline, neck, scalp, temple, and forehead. Repeat in 14 days.   triamcinolone ointment (KENALOG) 0.5 % Apply 1 Application topically 2 (two) times daily.   Turmeric (QC TUMERIC COMPLEX PO) Take by mouth daily.   [DISCONTINUED] apixaban (ELIQUIS) 5 MG TABS tablet Take 1 tablet (5 mg total) by mouth 2 (two) times daily.   No facility-administered encounter medications on file as of 02/11/2023.     History: Past Medical History:  Diagnosis Date   Diabetes mellitus    Diverticulitis    Gout    Hypercholesteremia    Hypertension    Kidney disease    Past Surgical History:  Procedure Laterality Date   CERVICAL DISCECTOMY      Family History  Problem Relation Age of Onset   Hyperlipidemia Mother    COPD Mother    Hypertension Father    Hyperlipidemia Father    Arthritis Father    Alcohol abuse Father    Stroke Father    Heart failure Father    Hypertension Sister    Hyperlipidemia Sister    Hypertension Brother    Hyperlipidemia Brother    Depression Brother    Cancer Brother  lung   Arthritis Brother    Anxiety disorder Brother    Alcohol abuse Brother    Epilepsy Daughter    Social History   Occupational History   Not on file  Tobacco Use   Smoking status: Former   Smokeless tobacco: Never  Vaping Use   Vaping status: Never Used  Substance and Sexual Activity   Alcohol use: Not Currently    Comment: Occasionally   Drug use: No   Sexual activity: Not Currently    Tobacco Counseling Counseling given: Not Answered   Immunizations and Health Maintenance Immunization History  Administered Date(s) Administered   Influenza Whole 03/28/2008   Health Maintenance Due  Topic Date Due   DTaP/Tdap/Td (1 - Tdap) Never done    Activities of Daily Living    02/11/2023    9:53 AM  In your present state of  health, do you have any difficulty performing the following activities:  Hearing? 0  Vision? 0  Difficulty concentrating or making decisions? 0  Walking or climbing stairs? 0  Dressing or bathing? 0  Doing errands, shopping? 0  Preparing Food and eating ? N  Using the Toilet? N  In the past six months, have you accidently leaked urine? Y  Do you have problems with loss of bowel control? N  Managing your Medications? N  Managing your Finances? N  Housekeeping or managing your Housekeeping? N    Physical Exam   Physical Exam Vitals and nursing note reviewed.  Constitutional:      Appearance: Normal appearance.  HENT:     Head: Normocephalic and atraumatic.  Cardiovascular:     Rate and Rhythm: Normal rate and regular rhythm.     Heart sounds: Normal heart sounds. No murmur heard. Pulmonary:     Effort: Pulmonary effort is normal. No respiratory distress.  Musculoskeletal:     Right lower leg: No edema.     Left lower leg: No edema.  Skin:    Findings: Rash (bed bug bites present to trunk, legs, arms) present.  Neurological:     General: No focal deficit present.     Mental Status: He is alert and oriented to person, place, and time.  Psychiatric:        Mood and Affect: Mood normal.        Behavior: Behavior normal.      Advanced Directives: Does Patient Have a Medical Advance Directive?: No Would patient like information on creating a medical advance directive?: No - Patient declined    Assessment:    This is a routine wellness  examination for this patient .   Vision/Hearing screen No results found.  Dietary issues and exercise activities discussed:      Goals      Have 3 meals a day        Depression Screen    02/11/2023   10:02 AM 01/04/2023    8:50 AM 10/01/2022    3:11 PM  PHQ 2/9 Scores  PHQ - 2 Score 0 0 0  PHQ- 9 Score 0 0      Fall Risk    01/04/2023    8:50 AM  Fall Risk   Falls in the past year? 0  Number falls in past yr: 0  Injury  with Fall? 0  Risk for fall due to : No Fall Risks  Follow up Education provided    Cognitive Function    02/11/2023   10:08 AM  MMSE - Mini Mental State  Exam  Orientation to time 5  Orientation to Place 5  Registration 3  Attention/ Calculation 3  Recall 2  Language- name 2 objects 2  Language- repeat 1  Language- follow 3 step command 3  Language- read & follow direction 1  Write a sentence 1  Copy design 1  Total score 27        Patient Care Team: Gabriel Earing, FNP as PCP - General (Family Medicine)     Plan:    Erskine Emery was seen today for medicare wellness.  Diagnoses and all orders for this visit:  Welcome to Medicare preventive visit -     EKG 12-Lead  Encounter for Medicare annual wellness exam  Right bundle branch block EKG with right BBB today. No previous to compare. No acute findings on EKG today.   Need for vaccination Tdap vaccine today.   Former smoker AAA screening discussed and ordered.  -     [place] MEDICARE SCREENING; Future  History of DVT of lower extremity Patient is request a lounge doctor. Reports he spoke to insurance agent who said it would be covered for him.  -     Cancel: For home use only DME Other see comment -     For home use only DME Other see comment  Bed bug bites Information provided for extermination.   I have personally reviewed and noted the following in the patient's chart:   Medical and social history Use of alcohol, tobacco or illicit drugs  Current medications and supplements Functional ability and status Nutritional status Physical activity Advanced directives List of other physicians Hospitalizations, surgeries, and ER visits in previous 12 months Vitals Screenings to include cognitive, depression, and falls Referrals and appointments  In addition, I have reviewed and discussed with patient certain preventive protocols, quality metrics, and best practice recommendations. A written personalized care  plan for preventive services as well as general preventive health recommendations were provided to patient.     Gabriel Earing, FNP 02/11/2023

## 2023-02-11 NOTE — Patient Instructions (Signed)
Kill the Bed Bugs Make sure the methods you select are safe, effective and legal. See What's Legal, What's Not. Avoid treatments that don't work. Rubbing alcohol, kerosene and gasoline could harm you and your family and can easily ignite with a spark or cigarette. Sticky traps are not for bed bugs, but they may be effective at catching spiders, cockroaches, and other insects. See below to learn how to make your own bed bug interceptor traps.  Consider non-chemical methods of killing bed bugs. Some will be more useful than others depending on your situation. These and other methods can be helpful, but they might not get rid of the infestation entirely:  Heat treatment: You can use a clothes dryer on high heat. You can also use black plastic bags in a hot, closed car in the sun, but success depends on your climate and other factors. Do-it-yourself heat treatments might not work. Professionals have access to more intensive and proven methods that can even treat whole houses with heat. Do not try to kill bed bugs by increasing your indoor temperature with a thermostat, propane space heater, or fireplace - this does not work and is dangerous.  Cold treatment may work, but can only be successful in the home environment if the freezer is set to [employer].  Many home refrigerator freezers are not cold enough  to kill bed bugs. You must leave the items in a sealed bag in the freezer at [facility] for three days. Always use a thermometer to check the temperature, since home freezers are not always set to 0o.  Steam cleaners (wet or dry) can get into cracks and fabrics to treat carpets, baseboards, bed frames, and other furniture. The steam temperature must be at least 130o F but should not have a forceful airflow, or it may cause bed bugs to scatter. Use a diffuser to prevent scattering. If needed, hire a Engineer, technical sales or use pesticides carefully according to the label directions:  Look for EPA-registered  pesticides that have bed bugs listed on the label. Use foggers (bug bombs) only with extreme care and only if bed bugs are listed on the label. Improper use can harm your health or cause a fire or explosion. Foggers should not be your only method of bed bug control. The spray will not reach the cracks and crevices where bed bugs hide. See Should I Use a Fogger? for more information. Carefully look for any evidence of bed bugs every few days after you complete your initial cleanup and control processes. If you see bed bugs, either the initial cleanup missed some bugs or eggs have hatched. Retreatment may be needed.;  Consider using different types of pesticides if repeated treatments are needed.  Desiccants (chemicals that dry things out) can be particularly effective in some situations since they work by drying out the bug (which means the bed bugs can't develop resistance). However, t hey may take several months to work. If using desiccants, be sure to use only products registered by EPA as a Pesticide. Pool or food grade diatomaceous earth should not be used. This type of diatomaceous earth (made from t he fossilized remains of tiny aquatic organisms called diatoms) can harm you when you breathe it in. The pesticide version  uses a different size of diatoms, which reduces the hazard. Bed bug interceptor traps can be placed under the legs of furniture to catch bed bugs and keep them from climbing the legs. Both commercial and do-it-yourself interceptors are options. Follow  the directions below to create your interceptor traps.

## 2023-02-11 NOTE — Addendum Note (Signed)
Addended by: Hessie Diener on: 02/11/2023 11:46 AM   Modules accepted: Orders

## 2023-02-15 ENCOUNTER — Ambulatory Visit: Payer: 59 | Admitting: Urology

## 2023-02-15 ENCOUNTER — Encounter: Payer: Self-pay | Admitting: Urology

## 2023-02-15 VITALS — BP 122/74 | HR 88

## 2023-02-15 DIAGNOSIS — N138 Other obstructive and reflux uropathy: Secondary | ICD-10-CM

## 2023-02-15 DIAGNOSIS — R351 Nocturia: Secondary | ICD-10-CM

## 2023-02-15 DIAGNOSIS — R972 Elevated prostate specific antigen [PSA]: Secondary | ICD-10-CM

## 2023-02-15 DIAGNOSIS — R339 Retention of urine, unspecified: Secondary | ICD-10-CM | POA: Diagnosis not present

## 2023-02-15 DIAGNOSIS — N401 Enlarged prostate with lower urinary tract symptoms: Secondary | ICD-10-CM | POA: Diagnosis not present

## 2023-02-15 LAB — URINALYSIS, ROUTINE W REFLEX MICROSCOPIC
Bilirubin, UA: NEGATIVE
Ketones, UA: NEGATIVE
Leukocytes,UA: NEGATIVE
Nitrite, UA: NEGATIVE
Protein,UA: NEGATIVE
RBC, UA: NEGATIVE
Specific Gravity, UA: 1.015 (ref 1.005–1.030)
Urobilinogen, Ur: 0.2 mg/dL (ref 0.2–1.0)
pH, UA: 6 (ref 5.0–7.5)

## 2023-02-15 LAB — BLADDER SCAN AMB NON-IMAGING: Scan Result: 191

## 2023-02-15 NOTE — Progress Notes (Signed)
H&P  Chief Complaint: Elevated PSA  History of Present Illness: 66 year old male referred by [name], FNP for evaluation and management of elevated PSA.  Review of old PSA data reveals PSA to be 2.5 in September, 2009.  Just over a month ago, PSA was 6.7, free to total ratio 36.Marland Kitchen  He states that he was seen by urologist in [facility] 10 to 15 years ago.  Apparently for PSA issues.  He has not been seen in years.  He is not on any medical therapy for BPH.  He does have bothersome urinary symptoms-IPSS 15/4.  Biggest issue is nocturia x 3.  He does snore.  Past Medical History:  Diagnosis Date   Diabetes mellitus    Diverticulitis    Gout    Hypercholesteremia    Hypertension    Kidney disease     Past Surgical History:  Procedure Laterality Date   CERVICAL DISCECTOMY      Home Medications:  Allergies as of 02/15/2023   No Known Allergies      Medication List        Accurate as of February 15, 2023  1:27 PM. If you have any questions, ask your nurse or doctor.          allopurinol 300 MG tablet Commonly known as: ZYLOPRIM Take 1 tablet (300 mg total) by mouth every other day.   aspirin EC 81 MG tablet Take 81 mg by mouth daily. Swallow whole.   chlorthalidone 25 MG tablet Commonly known as: HYGROTON Take 1 tablet by mouth daily.   empagliflozin 25 MG Tabs tablet Commonly known as: Jardiance Take 1 tablet (25 mg total) by mouth daily before breakfast.   ezetimibe 10 MG tablet Commonly known as: ZETIA Take 10 mg by mouth daily.   fish oil-omega-3 fatty acids 1000 MG capsule Take 2 g by mouth daily.   Garlic 500 MG Tabs Take 1 tablet by mouth daily.   glimepiride 2 MG tablet Commonly known as: AMARYL Take 1 tablet (2 mg total) by mouth 2 (two) times daily with a meal.   ibuprofen 800 MG tablet Commonly known as: ADVIL Take 1 tablet (800 mg total) by mouth 3 (three) times daily.   insulin glargine 100 UNIT/ML injection Commonly known as:  LANTUS Inject 100 Units into the skin at bedtime.   levothyroxine 125 MCG tablet Commonly known as: SYNTHROID Take 1 tablet (125 mcg total) by mouth every morning.   losartan 50 MG tablet Commonly known as: COZAAR Take 50 mg by mouth daily.   multivitamins ther. w/minerals Tabs tablet Take 1 tablet by mouth daily.   permethrin 5 % cream Commonly known as: ELIMITE Apply to all areas of the body from the neck to soles of feet. Leave on for 8 to 14 hours before removing by washing (shower or bath). For older adults, also apply on the hairline, neck, scalp, temple, and forehead. Repeat in 14 days.   QC TUMERIC COMPLEX PO Take by mouth daily.   triamcinolone ointment 0.5 % Commonly known as: KENALOG Apply 1 Application topically 2 (two) times daily.   Vitamin D3 125 MCG (5000 UT) Tabs Take 1 tablet by mouth daily.        Allergies: No Known Allergies  Family History  Problem Relation Age of Onset   Hyperlipidemia Mother    COPD Mother    Hypertension Father    Hyperlipidemia Father    Arthritis Father    Alcohol abuse Father  Stroke Father    Heart failure Father    Hypertension Sister    Hyperlipidemia Sister    Hypertension Brother    Hyperlipidemia Brother    Depression Brother    Cancer Brother        lung   Arthritis Brother    Anxiety disorder Brother    Alcohol abuse Brother    Epilepsy Daughter     Social History:  reports that he has quit smoking. He has never used smokeless tobacco. He reports that he does not currently use alcohol. He reports that he does not use drugs.  ROS: A complete review of systems was performed.  All systems are negative except for pertinent findings as noted.  Physical Exam:  Vital signs in last 24 hours: BP 122/74   Pulse 88  Constitutional:  Alert and oriented, No acute distress Cardiovascular: Regular rate  Respiratory: Normal respiratory effort Genitourinary: Normal male phallus, testes are descended bilaterally  and non-tender and without masses, scrotum is normal in appearance without lesions or masses, perineum is normal on inspection.  Normal anal sphincter tone.  Prostate 60 g, symmetric, nonnodular, nontender. Lymphatic: No lymphadenopathy Neurologic: Grossly intact, no focal deficits Psychiatric: Normal mood and affect  I have reviewed notes from referring/previous physicians  I have reviewed urinalysis results  I have reviewed prior total and percent free PSA results  I have reviewed bladder scan as well as IPSS form-residual urine volume 190 mL   Impression/Assessment:  1.  Elevated PSA, 6.8 with percent free 37, usually consistent with BPH/benign factors  2.  BPH with symptoms, incomplete bladder emptying, bothersome to the patient  3.  Nocturia  Plan:  1.  I will see about getting old records from [facility] in [facility]  2.  Causative factors of PSA elevation discussed with the patient  3.  Sodium, fluid restrictions discussed with patient  4.  I will have him come back in about 4 months following PSA

## 2023-02-26 ENCOUNTER — Ambulatory Visit (HOSPITAL_COMMUNITY)
Admission: RE | Admit: 2023-02-26 | Discharge: 2023-02-26 | Disposition: A | Discharge status: Home / Self Care | Payer: 59 | Source: Ambulatory Visit | Attending: Family Medicine | Admitting: Family Medicine

## 2023-02-26 DIAGNOSIS — Z87891 Personal history of nicotine dependence: Secondary | ICD-10-CM

## 2023-02-26 DIAGNOSIS — Z136 Encounter for screening for cardiovascular disorders: Secondary | ICD-10-CM | POA: Diagnosis not present

## 2023-03-01 ENCOUNTER — Ambulatory Visit: Payer: Self-pay | Admitting: Nutrition

## 2023-03-04 ENCOUNTER — Other Ambulatory Visit: Payer: Self-pay | Admitting: *Deleted

## 2023-03-04 MED ORDER — CHLORTHALIDONE 25 MG PO TABS: 25.0000 mg | ORAL_TABLET | Freq: Every day | ORAL | 3 refills | Status: DC

## 2023-03-12 ENCOUNTER — Telehealth: Payer: Self-pay | Admitting: Family Medicine

## 2023-03-12 NOTE — Telephone Encounter (Signed)
Left message for pt to call back to schedule a Diabetic Eye Exam if he wants one done here at [facility].

## 2023-04-05 ENCOUNTER — Ambulatory Visit: Payer: 59 | Admitting: "Endocrinology

## 2023-04-08 ENCOUNTER — Ambulatory Visit: Payer: 59 | Admitting: Family Medicine

## 2023-04-08 ENCOUNTER — Encounter: Payer: Self-pay | Admitting: Family Medicine

## 2023-04-08 VITALS — BP 107/61 | HR 72 | Temp 97.8°F | Ht 70.0 in | Wt 218.0 lb

## 2023-04-08 DIAGNOSIS — L299 Pruritus, unspecified: Secondary | ICD-10-CM

## 2023-04-08 DIAGNOSIS — E785 Hyperlipidemia, unspecified: Secondary | ICD-10-CM

## 2023-04-08 DIAGNOSIS — E039 Hypothyroidism, unspecified: Secondary | ICD-10-CM | POA: Diagnosis not present

## 2023-04-08 DIAGNOSIS — I152 Hypertension secondary to endocrine disorders: Secondary | ICD-10-CM | POA: Diagnosis not present

## 2023-04-08 DIAGNOSIS — E1159 Type 2 diabetes mellitus with other circulatory complications: Secondary | ICD-10-CM

## 2023-04-08 DIAGNOSIS — E1169 Type 2 diabetes mellitus with other specified complication: Secondary | ICD-10-CM | POA: Diagnosis not present

## 2023-04-08 DIAGNOSIS — E1165 Type 2 diabetes mellitus with hyperglycemia: Secondary | ICD-10-CM

## 2023-04-08 DIAGNOSIS — Z794 Long term (current) use of insulin: Secondary | ICD-10-CM | POA: Diagnosis not present

## 2023-04-08 DIAGNOSIS — Z7984 Long term (current) use of oral hypoglycemic drugs: Secondary | ICD-10-CM | POA: Diagnosis not present

## 2023-04-08 DIAGNOSIS — Z86718 Personal history of other venous thrombosis and embolism: Secondary | ICD-10-CM

## 2023-04-08 DIAGNOSIS — N1831 Chronic kidney disease, stage 3a: Secondary | ICD-10-CM | POA: Diagnosis not present

## 2023-04-08 DIAGNOSIS — M1A30X0 Chronic gout due to renal impairment, unspecified site, without tophus (tophi): Secondary | ICD-10-CM

## 2023-04-08 LAB — CMP14+EGFR
ALT: 46 [IU]/L — ABNORMAL HIGH (ref 0–44)
AST: 34 [IU]/L (ref 0–40)
Albumin: 4.3 g/dL (ref 3.9–4.9)
Alkaline Phosphatase: 67 [IU]/L (ref 44–121)
BUN/Creatinine Ratio: 23 (ref 10–24)
BUN: 28 mg/dL — ABNORMAL HIGH (ref 8–27)
Bilirubin Total: <0.2 mg/dL (ref 0.0–1.2)
CO2: 23 mmol/L (ref 20–29)
Calcium: 9.9 mg/dL (ref 8.6–10.2)
Chloride: 101 mmol/L (ref 96–106)
Creatinine, Ser: 1.23 mg/dL (ref 0.76–1.27)
Globulin, Total: 2.6 g/dL (ref 1.5–4.5)
Glucose: 161 mg/dL — ABNORMAL HIGH (ref 70–99)
Potassium: 4.1 mmol/L (ref 3.5–5.2)
Sodium: 142 mmol/L (ref 134–144)
Total Protein: 6.9 g/dL (ref 6.0–8.5)
eGFR: 65 mL/min/{1.73_m2} (ref >59)

## 2023-04-08 LAB — CBC WITH DIFFERENTIAL/PLATELET
Basophils Absolute: 0 10*3/uL (ref 0.0–0.2)
Basos: 0 %
EOS (ABSOLUTE): 0.2 10*3/uL (ref 0.0–0.4)
Eos: 2 %
Hematocrit: 40.7 % (ref 37.5–51.0)
Hemoglobin: 13.6 g/dL (ref 13.0–17.7)
Immature Grans (Abs): 0.1 10*3/uL (ref 0.0–0.1)
Immature Granulocytes: 1 %
Lymphocytes Absolute: 2.2 10*3/uL (ref 0.7–3.1)
Lymphs: 25 %
MCH: 30 pg (ref 26.6–33.0)
MCHC: 33.4 g/dL (ref 31.5–35.7)
MCV: 90 fL (ref 79–97)
Monocytes Absolute: 0.9 10*3/uL (ref 0.1–0.9)
Monocytes: 10 %
Neutrophils Absolute: 5.5 10*3/uL (ref 1.4–7.0)
Neutrophils: 62 %
Platelets: 247 10*3/uL (ref 150–450)
RBC: 4.53 x10E6/uL (ref 4.14–5.80)
RDW: 13 % (ref 11.6–15.4)
WBC: 8.9 10*3/uL (ref 3.4–10.8)

## 2023-04-08 LAB — BAYER DCA HB A1C WAIVED: HB A1C (BAYER DCA - WAIVED): 8.2 % — ABNORMAL HIGH (ref 4.8–5.6)

## 2023-04-08 MED ORDER — LEVOCETIRIZINE DIHYDROCHLORIDE 5 MG PO TABS: 5.0000 mg | ORAL_TABLET | Freq: Every evening | ORAL | 3 refills | Status: AC

## 2023-04-08 MED ORDER — SITAGLIPTIN PHOSPHATE 100 MG PO TABS
100.0000 mg | ORAL_TABLET | Freq: Every day | ORAL | 3 refills | Status: DC
Start: 2023-04-08 — End: 2023-07-28

## 2023-04-08 NOTE — Progress Notes (Signed)
Established Patient Office Visit  Subjective   Patient ID: Earl Harvey, male    DOB: [birth year]  Age: 67 y.o. MRN: [id number]  Chief Complaint  Patient presents with   Medical Management of Chronic Issues   Diabetes   Chronic Kidney Disease    HPI  T2DM Pt presents for follow up evaluation of Type 2 diabetes mellitus. Patient denies foot ulcerations, increased appetite, nausea, paresthesia of the feet, polydipsia, polyuria, visual disturbances, vomiting, and weight loss.  Current diabetic medications include Jardiance,  glimepiride, lantus 100 units  Compliant with meds - Yes  Current monitoring regimen: home blood tests - 2-3 times daily. Average 155 Any episodes of hypoglycemia? no  2. HTN Complaint with meds - Yes Current Medications - losartan Pertinent ROS:  Headache - No Fatigue - No Visual Disturbances - No Chest pain - No Dyspnea - No Palpitations - No LE edema - No  3. Gout No flares in years. On allopurinol daily.   4. Thyroid Compliant with medications - Yes Current medications - synthroid 125 mcg Weight -  stable Bowel habit changes - No Heat or cold intolerance - No Mood changes - No Changes in sleep habits - No Fatigue - No Skin, hair, or nail changes - No Tremor - No Palpitations - No Edema - No Shortness of breath - No  5. Itching Continues to have itching all over. Has tried hydrocortisone cream without improvement. Isn't sure if there is a rash. Back itches the most.   Past Medical History:  Diagnosis Date   Diabetes mellitus    Diverticulitis    Gout    Hypercholesteremia    Hypertension    Kidney disease       ROS As per HPI.   Objective:     BP 107/61   Pulse 72   Temp 97.8 F (36.6 C) (Temporal)   Ht 5\' 10"  (1.778 m)   Wt 218 lb (98.9 kg)   SpO2 97%   BMI 31.28 kg/m  BP Readings from Last 3 Encounters:  04/08/23 107/61  02/15/23 122/74  02/11/23 (!) 110/56    Physical Exam Vitals and nursing note  reviewed.  Constitutional:      General: He is not in acute distress.    Appearance: He is not ill-appearing, toxic-appearing or diaphoretic.  Eyes:     General: No scleral icterus. Neck:     Thyroid: No thyroid mass, thyromegaly or thyroid tenderness.     Vascular: No carotid bruit.  Cardiovascular:     Rate and Rhythm: Normal rate and regular rhythm.     Heart sounds: Normal heart sounds. No murmur heard. Pulmonary:     Effort: Pulmonary effort is normal. No respiratory distress.     Breath sounds: Normal breath sounds.  Abdominal:     General: Bowel sounds are normal. There is no distension.     Palpations: Abdomen is soft.     Tenderness: There is no abdominal tenderness. There is no guarding or rebound.  Musculoskeletal:     Cervical back: Neck supple. No rigidity.  Skin:    General: Skin is warm and dry.     Coloration: Skin is not jaundiced.     Findings: No erythema or rash.  Neurological:     General: No focal deficit present.     Mental Status: He is alert and oriented to person, place, and time.  Psychiatric:        Mood and Affect: Mood normal.  Behavior: Behavior normal.        Thought Content: Thought content normal.        Judgment: Judgment normal.      No results found for any visits on 04/08/23.    The 10-year ASCVD risk score (Arnett DK, et al., 2019) is: 24%    Assessment & Plan:   Earl Harvey was seen today for medical management of chronic issues, diabetes and chronic kidney disease.  Diagnoses and all orders for this visit:  Type 2 diabetes mellitus with hyperglycemia, with long-term current use of insulin (HCC) A1c 8.2 today, not at goal of <7. Medication changes today: add [place]. Continue jardiance, glimerpiride, lantus. He is on an ACE/ARB and has declined a statin. Eye exam: UTD. Foot exam: UTD. Urine micro: UTD. Diet and exercise.  -     CMP14+EGFR -     sitaGLIPtin (JANUVIA) 100 MG tablet; Take 1 tablet (100 mg total) by mouth  daily.       -     Bayer DCA Hb A1c Waived  Long term current use of oral hypoglycemic drug  Hypertension associated with type 2 diabetes mellitus (HCC) Well controlled on current regimen.   Hyperlipidemia associated with type 2 diabetes mellitus (HCC) Last LDL 91. On zetia. Declined statin.   Stage 3a chronic kidney disease (HCC) -     CBC with Differential/Platelet  Acquired hypothyroidism Last TSH well controlled.   Chronic gout due to renal impairment without tophus, unspecified site Well controlled with allopurinol.   History of DVT of lower extremity Managed by vascular. No longer on anticoagulation. Plan for repeat imaging in 1 year.   Pruritus No rash or jaundice on exam. Try xyzal as below.  -     levocetirizine (XYZAL) 5 MG tablet; Take 1 tablet (5 mg total) by mouth every evening.   Return in about 3 months (around 07/09/2023) for chronic follow up.   The patient indicates understanding of these issues and agrees with the plan.  Earl Earing, FNP

## 2023-04-16 ENCOUNTER — Other Ambulatory Visit: Payer: Self-pay

## 2023-04-16 ENCOUNTER — Encounter (HOSPITAL_BASED_OUTPATIENT_CLINIC_OR_DEPARTMENT_OTHER): Payer: Self-pay | Admitting: Pediatrics

## 2023-04-16 ENCOUNTER — Emergency Department (HOSPITAL_BASED_OUTPATIENT_CLINIC_OR_DEPARTMENT_OTHER)
Admission: EM | Admit: 2023-04-16 | Discharge: 2023-04-16 | Disposition: A | Discharge status: Home / Self Care | Payer: 59 | Attending: Emergency Medicine | Admitting: Emergency Medicine

## 2023-04-16 DIAGNOSIS — N189 Chronic kidney disease, unspecified: Secondary | ICD-10-CM | POA: Diagnosis not present

## 2023-04-16 DIAGNOSIS — L299 Pruritus, unspecified: Secondary | ICD-10-CM

## 2023-04-16 DIAGNOSIS — Z7982 Long term (current) use of aspirin: Secondary | ICD-10-CM | POA: Diagnosis not present

## 2023-04-16 DIAGNOSIS — E1122 Type 2 diabetes mellitus with diabetic chronic kidney disease: Secondary | ICD-10-CM | POA: Insufficient documentation

## 2023-04-16 DIAGNOSIS — L509 Urticaria, unspecified: Secondary | ICD-10-CM | POA: Diagnosis not present

## 2023-04-16 DIAGNOSIS — Z794 Long term (current) use of insulin: Secondary | ICD-10-CM | POA: Diagnosis not present

## 2023-04-16 DIAGNOSIS — I129 Hypertensive chronic kidney disease with stage 1 through stage 4 chronic kidney disease, or unspecified chronic kidney disease: Secondary | ICD-10-CM | POA: Diagnosis not present

## 2023-04-16 DIAGNOSIS — Z79899 Other long term (current) drug therapy: Secondary | ICD-10-CM | POA: Insufficient documentation

## 2023-04-16 DIAGNOSIS — E039 Hypothyroidism, unspecified: Secondary | ICD-10-CM | POA: Diagnosis not present

## 2023-04-16 DIAGNOSIS — Z7984 Long term (current) use of oral hypoglycemic drugs: Secondary | ICD-10-CM | POA: Insufficient documentation

## 2023-04-16 DIAGNOSIS — R21 Rash and other nonspecific skin eruption: Secondary | ICD-10-CM | POA: Diagnosis present

## 2023-04-16 MED ORDER — HYDROXYZINE HCL 25 MG PO TABS: 25.0000 mg | ORAL_TABLET | Freq: Four times a day (QID) | ORAL | 0 refills | Status: AC | PRN

## 2023-04-16 MED ORDER — DEXAMETHASONE SODIUM PHOSPHATE 10 MG/ML IJ SOLN
10.0000 mg | Freq: Once | INTRAMUSCULAR | Status: AC
  Administered 2023-04-16: 10 mg via INTRAMUSCULAR
  Filled 2023-04-16: qty 1

## 2023-04-16 NOTE — ED Provider Notes (Signed)
[place] [facility] AT [facility] Provider Note   CSN: [id number] Arrival date & time: 04/16/23  1145     History  Chief Complaint  Patient presents with   Rash    Earl Harvey is a 67 y.o. male.  HPI      67 year old male with a history of type 2 diabetes, hypertension, hyperlipidemia, CKD, gout, hypothyroidism, prior history of DVT no longer on anticoagulation and followed by vascular surgery who presents with concern for itching and sensation other microscopic bugs crawling on him.   He discussed pruritus with his primary care provider who gave him a prescription for cetirizine  He reports that he previously had bedbugs in his house and eradicated them so he thought, but continues to have a sensation of things crawling all over him reports that he believes he now has microscopic bedbugs that he or no one else can see, but he feels them crawling all over him and biting him all the time.  He reports he will feel them sometimes but at night and turn the lights and not see anything there, when previously he did see bedbugs in his home.  His girlfriend who he goes to stay with at times does not have this sensation or itching, and he has a roommate and another part of a 3 bedroom house who also is not experiencing this.  He reports he has previously seen his primary care physician as well as a dermatologist.  He reports that everyone thinks that it is his nerves are in his head.  He is convinced that there are microscopic bugs on him.  He tried taking permethrin without any relief.  Reports he has had scabies in the past and does not feel like that. Has not had fever, nausea, vomiting or other concerns.    Past Medical History:  Diagnosis Date   Diabetes mellitus    Diverticulitis    Gout    Hypercholesteremia    Hypertension    Kidney disease      Home Medications Prior to Admission medications   Medication Sig Start Date End Date Taking?  Authorizing Provider  hydrOXYzine (ATARAX) 25 MG tablet Take 1 tablet (25 mg total) by mouth every 6 (six) hours as needed for itching. 04/16/23  Yes Alvira Monday, MD  allopurinol (ZYLOPRIM) 300 MG tablet Take 1 tablet (300 mg total) by mouth every other day. 05/19/11   Hassan Rowan, MD  aspirin EC 81 MG tablet Take 81 mg by mouth daily. Swallow whole.    [provider]  chlorthalidone (HYGROTON) 25 MG tablet Take 1 tablet (25 mg total) by mouth daily. 03/04/23   Gabriel Earing, FNP  Cholecalciferol (VITAMIN D3) 125 MCG (5000 UT) TABS Take 1 tablet by mouth daily.    [provider]  empagliflozin (JARDIANCE) 25 MG TABS tablet Take 1 tablet (25 mg total) by mouth daily before breakfast. 01/04/23   Gabriel Earing, FNP  ezetimibe (ZETIA) 10 MG tablet Take 10 mg by mouth daily.    [provider]  fish oil-omega-3 fatty acids 1000 MG capsule Take 2 g by mouth daily.      [provider]  Garlic 500 MG TABS Take 1 tablet by mouth daily.      [provider]  glimepiride (AMARYL) 2 MG tablet Take 1 tablet (2 mg total) by mouth 2 (two) times daily with a meal. 02/08/23   Gabriel Earing, FNP  ibuprofen (ADVIL,MOTRIN) 800 MG  tablet Take 1 tablet (800 mg total) by mouth 3 (three) times daily. 09/19/12   Elpidio Anis, PA-C  insulin glargine (LANTUS) 100 UNIT/ML injection Inject 100 Units into the skin at bedtime. 07/21/11   Hassan Rowan, MD  levocetirizine (XYZAL) 5 MG tablet Take 1 tablet (5 mg total) by mouth every evening. 04/08/23   Gabriel Earing, FNP  levothyroxine (SYNTHROID) 125 MCG tablet Take 1 tablet (125 mcg total) by mouth every morning. 11/02/22   Gabriel Earing, FNP  losartan (COZAAR) 50 MG tablet Take 50 mg by mouth daily.    [provider]  Multiple Vitamins-Minerals (MULTIVITAMINS THER. W/MINERALS) TABS Take 1 tablet by mouth daily.      [provider]  sitaGLIPtin (JANUVIA) 100 MG tablet Take 1 tablet (100 mg  total) by mouth daily. 04/08/23   Gabriel Earing, FNP  triamcinolone ointment (KENALOG) 0.5 % Apply 1 Application topically 2 (two) times daily. 01/06/23   Gabriel Earing, FNP  Turmeric (QC TUMERIC COMPLEX PO) Take by mouth daily.    [provider]      Allergies    Patient has no known allergies.    Review of Systems   Review of Systems  Physical Exam Updated Vital Signs BP (!) 165/82 (BP Location: Left Arm)   Pulse 89   Temp 98.5 F (36.9 C) (Oral)   Resp 17   Ht 5\' 10"  (1.778 m)   Wt 98.9 kg   SpO2 97%   BMI 31.28 kg/m  Physical Exam Vitals and nursing note reviewed.  Constitutional:      General: He is not in acute distress.    Appearance: Normal appearance. He is not ill-appearing, toxic-appearing or diaphoretic.  HENT:     Head: Normocephalic.  Eyes:     Conjunctiva/sclera: Conjunctivae normal.  Cardiovascular:     Rate and Rhythm: Normal rate and regular rhythm.     Pulses: Normal pulses.  Pulmonary:     Effort: Pulmonary effort is normal. No respiratory distress.  Musculoskeletal:        General: No deformity or signs of injury.     Cervical back: No rigidity.     Comments: Small ulcerations to top of head, arms particularly left arm, over back of legs No lesions to center of back, low back with ulcerations  Skin:    General: Skin is warm and dry.     Coloration: Skin is not jaundiced or pale.  Neurological:     General: No focal deficit present.     Mental Status: He is alert and oriented to person, place, and time.     ED Results / Procedures / Treatments   Labs (all labs ordered are listed, but only abnormal results are displayed) Labs Reviewed - No data to display  EKG None  Radiology No results found.  Procedures Procedures    Medications Ordered in ED Medications  dexamethasone (DECADRON) injection 10 mg (10 mg Intramuscular Given 04/16/23 1329)    ED Course/ Medical Decision Making/ A&P                                   67 year old male with a history of type 2 diabetes, hypertension, hyperlipidemia, CKD, gout, hypothyroidism, prior history of DVT no longer on anticoagulation and followed by vascular surgery who presents with concern for itching and sensation other microscopic bugs crawling on him.  Exam does show multiple  small areas of ulceration where he has scratched--of note they are not present in the middle of his back/areas that he cannot reach himself.    Discussed I do not see a type of emergent rash that would require hospitalization or surgery today.   He does not have findings that are typical for scabies on exam, no sign of cellulitis, abscess, or hives.   Reviewed labs that he had done as an outpatient with his primary care doctor 10/4 which showed a normal bilirubin, Cr 1.23, no clinically significant electrolyte abnormalities, also had a normal TSH performed since the symptoms started 01/2023.  Discussed it is difficult for me to say whether or not he is being bit by something, or whether his symptoms are secondary to a delusional parasitosis or contact dermatitis.  Regardless, given the degree of pruritus he has and possible allergic phenomena, discussed the potential risks of steroids and will give 1 dose of Decadron in the emergency department.  In addition, have given him a prescription for hydroxyzine.  Recommend continued follow-up with his primary care doctor, dermatology. Patient discharged in stable condition with understanding of reasons to return.         Final Clinical Impression(s) / ED Diagnoses Final diagnoses:  Pruritus    Rx / DC Orders ED Discharge Orders          Ordered    hydrOXYzine (ATARAX) 25 MG tablet  Every 6 hours PRN        04/16/23 1322              Alvira Monday, MD 04/16/23 1335

## 2023-04-16 NOTE — ED Triage Notes (Signed)
C/O itchy rash since December.

## 2023-05-24 DIAGNOSIS — L308 Other specified dermatitis: Secondary | ICD-10-CM | POA: Diagnosis not present

## 2023-05-24 DIAGNOSIS — B86 Scabies: Secondary | ICD-10-CM | POA: Diagnosis not present

## 2023-06-07 ENCOUNTER — Telehealth: Payer: Self-pay | Admitting: Family Medicine

## 2023-06-07 DIAGNOSIS — N1831 Chronic kidney disease, stage 3a: Secondary | ICD-10-CM

## 2023-06-07 DIAGNOSIS — E1165 Type 2 diabetes mellitus with hyperglycemia: Secondary | ICD-10-CM

## 2023-06-07 DIAGNOSIS — E1169 Type 2 diabetes mellitus with other specified complication: Secondary | ICD-10-CM

## 2023-06-07 DIAGNOSIS — I152 Hypertension secondary to endocrine disorders: Secondary | ICD-10-CM

## 2023-06-07 NOTE — Telephone Encounter (Signed)
I've placed a referral to [facility] for help with medication management. We can add metformin 500 mg BID if he is interested in the meantime. Start with 500 mg in the morning for one week, then increase to BID. Ok to sent Rx if he is agreeable.

## 2023-06-07 NOTE — Telephone Encounter (Signed)
Pt is calling in to see if he can increase his jardince  medication up to 50 mg he is on 25 mg now he would like to take two a day the other medication that he was put on  is making his sugar worse the medication he would like a a callback

## 2023-06-07 NOTE — Telephone Encounter (Signed)
Pt states since starting the Januvia his FBS have been 150-200 and he wanted to increase his Jardiance to 50mg  but per TM 25mg  is the highest dose. Pt is also still doing his Lantus 100mg  and wants to know if there is anything else he can do. Pt is aware we may need to put referral into Raynelle Fanning our clinical pharmacist and is ok with this. Please advise.

## 2023-06-07 NOTE — Telephone Encounter (Signed)
Pt aware of referral but states he isn't going to take the metformin.

## 2023-06-08 ENCOUNTER — Other Ambulatory Visit: Payer: Self-pay | Admitting: Family Medicine

## 2023-06-08 NOTE — Telephone Encounter (Signed)
Copied from CRM [id number]. Topic: Clinical - Medication Refill >> Jun 08, 2023  2:07 PM Fuller Mandril wrote: Most Recent Primary Care Visit:  Provider: Gabriel Earing  Department: [facility]  Visit Type: OFFICE VISIT  Date: 04/08/2023  Medication: insulin glargine (LANTUS) 100 UNIT/ML injection  Has the patient contacted their pharmacy? Yes (Agent: If no, request that the patient contact the pharmacy for the refill. If patient does not wish to contact the pharmacy document the reason why and proceed with request.) (Agent: If yes, when and what did the pharmacy advise?)  Is this the correct pharmacy for this prescription? Yes If no, delete pharmacy and type the correct one.  This is the patient's preferred pharmacy:  [facility] [address] [place] - [address] [place] [address] [place] [address] Phone: [phone] Fax: [phone]   Has the prescription been filled recently? No  Is the patient out of the medication? No - only has a few tubes left   Has the patient been seen for an appointment in the last year OR does the patient have an upcoming appointment? Yes  Can we respond through MyChart? No  Agent: Please be advised that Rx refills may take up to 3 business days. We ask that you follow-up with your pharmacy.

## 2023-06-09 ENCOUNTER — Telehealth: Payer: Self-pay

## 2023-06-09 NOTE — Telephone Encounter (Signed)
  Tiffany is aware pt is on Lantus 100units as pt had previously seen Endocrinology but didn't want to anymore and didn't need refills when he was here for his visit in office so she has never filled it. She is out of office till next week, will route to covering provider. Ok to refill?     Copied from CRM [id number]. Topic: Clinical - Medication Refill >> Jun 09, 2023  3:46 PM Amy B wrote: Most Recent Primary Care Visit:  Provider: Gabriel Earing  Department: [facility]  Visit Type: OFFICE VISIT  Date: 04/08/2023  Medication: insulin glargine (LANTUS) 100 UNIT/ML injection   Has the patient contacted their pharmacy? Yes (Agent: If no, request that the patient contact the pharmacy for the refill. If patient does not wish to contact the pharmacy document the reason why and proceed with request.) (Agent: If yes, when and what did the pharmacy advise?)  Is this the correct pharmacy for this prescription? Yes If no, delete pharmacy and type the correct one.  This is the patient's preferred pharmacy:  [facility] [address], [place] - [address] [place] [address] [place] [address] Phone: [phone] Fax: [phone]   Has the prescription been filled recently? No  Is the patient out of the medication? No  Has the patient been seen for an appointment in the last year OR does the patient have an upcoming appointment? Yes  Can we respond through MyChart? Yes  Agent: Please be advised that Rx refills may take up to 3 business days. We ask that you follow-up with your pharmacy.

## 2023-06-09 NOTE — Progress Notes (Signed)
   Care Guide Note  06/09/2023 Name: BRAYDYN DELAROCHA MRN: [id number] DOB: [birth year]  Referred by: Gabriel Earing, FNP Reason for referral : Care Coordination (Outreach to schedule with Pharmd )   Donal MARCUS BOUDER is a 67 y.o. year old male who is a primary care patient of Gabriel Earing, FNP. Laquincy Judie Petit Sledge was referred to the pharmacist for assistance related to HTN, HLD, and DM.    An unsuccessful telephone outreach was attempted today to contact the patient who was referred to the pharmacy team for assistance with medication assistance. Additional attempts will be made to contact the patient.   Penne Lash , RMA     [facility]  [facility], [facility] Guide  Direct Dial: [phone]  Website: [email or web address]

## 2023-06-09 NOTE — Progress Notes (Signed)
   Care Guide Note  06/09/2023 Name: Earl Harvey MRN: [id number] DOB: 1956-06-20  Referred by: Gabriel Earing, FNP Reason for referral : Care Coordination (Outreach to schedule with Pharmd )   Earl Harvey is a 67 y.o. year old male who is a primary care patient of Gabriel Earing, FNP. Earl Harvey was referred to the pharmacist for assistance related to HTN, HLD, and DM.    Successful contact was made with the patient to discuss pharmacy services including being ready for the pharmacist to call at least 5 minutes before the scheduled appointment time, to have medication bottles and any blood sugar or blood pressure readings ready for review. The patient agreed to meet with the pharmacist via with the pharmacist via telephone visit on (date/time).  06/11/2023  Penne Lash , RMA     [place]  [facility], [facility] Guide  Direct Dial: [phone]  Website: [email or web address]

## 2023-06-10 ENCOUNTER — Telehealth: Payer: Self-pay

## 2023-06-10 ENCOUNTER — Other Ambulatory Visit: Payer: Self-pay | Admitting: Nurse Practitioner

## 2023-06-10 DIAGNOSIS — H43813 Vitreous degeneration, bilateral: Secondary | ICD-10-CM | POA: Diagnosis not present

## 2023-06-10 DIAGNOSIS — H401134 Primary open-angle glaucoma, bilateral, indeterminate stage: Secondary | ICD-10-CM | POA: Diagnosis not present

## 2023-06-10 DIAGNOSIS — E1165 Type 2 diabetes mellitus with hyperglycemia: Secondary | ICD-10-CM

## 2023-06-10 DIAGNOSIS — H25812 Combined forms of age-related cataract, left eye: Secondary | ICD-10-CM | POA: Diagnosis not present

## 2023-06-10 MED ORDER — INSULIN GLARGINE 100 UNIT/ML SOLOSTAR PEN
PEN_INJECTOR | SUBCUTANEOUS | 5 refills | Status: DC
Start: 2023-06-10 — End: 2023-08-24

## 2023-06-10 MED ORDER — INSULIN GLARGINE 100 UNIT/ML ~~LOC~~ SOLN: 100.0000 [IU] | Freq: Every day | SUBCUTANEOUS | 3 refills | Status: DC

## 2023-06-10 NOTE — Telephone Encounter (Signed)
Patient aware.

## 2023-06-10 NOTE — Telephone Encounter (Signed)
Katie, [facility] Pharmacist, called to let us know patient's Lantus was sent in as vials and should have been sent in as the Lantus Solostar pens.  Can you correct and resend please.

## 2023-06-10 NOTE — Addendum Note (Signed)
Addended by: Daisy Blossom on: 06/10/2023 01:45 PM   Modules accepted: Orders

## 2023-06-10 NOTE — Telephone Encounter (Signed)
Refill sent to pharmacy for pt. Left voicemail informing pt refill was sent

## 2023-06-11 ENCOUNTER — Other Ambulatory Visit: Payer: 59

## 2023-06-11 ENCOUNTER — Telehealth: Payer: Self-pay | Admitting: Pharmacist

## 2023-06-11 NOTE — Telephone Encounter (Signed)
Patient cancelled appt He stated he will no longer be coming here and has an appt with [facility] starting in January Message send to office management

## 2023-06-15 ENCOUNTER — Other Ambulatory Visit: Payer: 59

## 2023-06-15 DIAGNOSIS — R972 Elevated prostate specific antigen [PSA]: Secondary | ICD-10-CM

## 2023-06-15 NOTE — Progress Notes (Signed)
History of Present Illness:   8.12.2024: Initial visit for elevated PSA. Old PSA data reveals PSA to be 2.5 in September, 2009.  Just over a month ago, PSA was 6.7, free to total ratio 36.Marland Kitchen  He states that he was seen by urologist in [facility] 10 to 15 years ago.  Apparently for PSA issues.  He has not been seen in years.  IPSS 15/4.  Biggest issue is nocturia x 3.  He does snore.  12.17.2024: PSA 6.2.  We did receive/find records from his visit with Dr. Lindley Magnus in [facility] in 2016.  At that time, his PSA was in the 4.4 range.  IPSS 13/5.  Biggest issue is nocturia x 4, urgency and frequency.  He did get the overactive bladder guide sheet at his last visit.  That has not helped a lot.    Past Medical History:  Diagnosis Date   Diabetes mellitus    Diverticulitis    Gout    Hypercholesteremia    Hypertension    Kidney disease     Past Surgical History:  Procedure Laterality Date   CERVICAL DISCECTOMY      Home Medications:  Allergies as of 06/22/2023   No Known Allergies      Medication List        Accurate as of June 15, 2023 11:38 AM. If you have any questions, ask your nurse or doctor.          allopurinol 300 MG tablet Commonly known as: ZYLOPRIM Take 1 tablet (300 mg total) by mouth every other day.   aspirin EC 81 MG tablet Take 81 mg by mouth daily. Swallow whole.   chlorthalidone 25 MG tablet Commonly known as: HYGROTON Take 1 tablet (25 mg total) by mouth daily.   empagliflozin 25 MG Tabs tablet Commonly known as: Jardiance Take 1 tablet (25 mg total) by mouth daily before breakfast.   ezetimibe 10 MG tablet Commonly known as: ZETIA Take 10 mg by mouth daily.   fish oil-omega-3 fatty acids 1000 MG capsule Take 2 g by mouth daily.   Garlic 500 MG Tabs Take 1 tablet by mouth daily.   glimepiride 2 MG tablet Commonly known as: AMARYL Take 1 tablet (2 mg total) by mouth 2 (two) times daily with a meal.   hydrOXYzine 25 MG  tablet Commonly known as: ATARAX Take 1 tablet (25 mg total) by mouth every 6 (six) hours as needed for itching.   ibuprofen 800 MG tablet Commonly known as: ADVIL Take 1 tablet (800 mg total) by mouth 3 (three) times daily.   insulin glargine 100 UNIT/ML Solostar Pen Commonly known as: LANTUS Inject 1 ml  under the skin   levocetirizine 5 MG tablet Commonly known as: XYZAL Take 1 tablet (5 mg total) by mouth every evening.   levothyroxine 125 MCG tablet Commonly known as: SYNTHROID Take 1 tablet (125 mcg total) by mouth every morning.   losartan 50 MG tablet Commonly known as: COZAAR Take 50 mg by mouth daily.   multivitamins ther. w/minerals Tabs tablet Take 1 tablet by mouth daily.   QC TUMERIC COMPLEX PO Take by mouth daily.   sitaGLIPtin 100 MG tablet Commonly known as: Januvia Take 1 tablet (100 mg total) by mouth daily.   triamcinolone ointment 0.5 % Commonly known as: KENALOG Apply 1 Application topically 2 (two) times daily.   Vitamin D3 125 MCG (5000 UT) Tabs Take 1 tablet by mouth daily.  Allergies: No Known Allergies  Family History  Problem Relation Age of Onset   Hyperlipidemia Mother    COPD Mother    Hypertension Father    Hyperlipidemia Father    Arthritis Father    Alcohol abuse Father    Stroke Father    Heart failure Father    Hypertension Sister    Hyperlipidemia Sister    Hypertension Brother    Hyperlipidemia Brother    Depression Brother    Cancer Brother        lung   Arthritis Brother    Anxiety disorder Brother    Alcohol abuse Brother    Epilepsy Daughter     Social History:  reports that he has quit smoking. He has never used smokeless tobacco. He reports that he does not currently use alcohol. He reports that he does not use drugs.  ROS: A complete review of systems was performed.  All systems are negative except for pertinent findings as noted.  Physical Exam:  Vital signs in last 24 hours: There were no  vitals taken for this visit. Constitutional:  Alert and oriented, No acute distress Cardiovascular: Regular rate  Respiratory: Normal respiratory effort Neurologic: Grossly intact, no focal deficits Psychiatric: Normal mood and affect  I have reviewed notes from referring/previous physicians--[facility] notes  I have reviewed urinalysis results  I have reviewed prior total and percent free PSA results  I have reviewed bladder scan as well as IPSS form-residual urine volume from last visit 190 mL   Impression/Assessment:  1.  Elevated PSA--6.2.  High free to total ratio.  PSA 8 years ago not much lower.  I feel like this is related to his large prostate  2.  BPH with symptoms, fairly stable, he does not want medical therapy  3.  Nocturia  Plan:  1.  At this point I do not think we have to proceed with a biopsy, as I do think he more than likely has benign factor causing his elevated PSA  2.  Can come back in 6 months following PSA-in our [employer] office

## 2023-06-16 LAB — PSA: Prostate Specific Ag, Serum: 6.2 ng/mL — ABNORMAL HIGH (ref 0.0–4.0)

## 2023-06-22 ENCOUNTER — Ambulatory Visit (INDEPENDENT_AMBULATORY_CARE_PROVIDER_SITE_OTHER): Payer: 59 | Admitting: Urology

## 2023-06-22 VITALS — BP 112/63 | HR 86

## 2023-06-22 DIAGNOSIS — R972 Elevated prostate specific antigen [PSA]: Secondary | ICD-10-CM | POA: Diagnosis not present

## 2023-06-22 DIAGNOSIS — N138 Other obstructive and reflux uropathy: Secondary | ICD-10-CM

## 2023-06-22 DIAGNOSIS — R351 Nocturia: Secondary | ICD-10-CM | POA: Diagnosis not present

## 2023-06-22 DIAGNOSIS — N401 Enlarged prostate with lower urinary tract symptoms: Secondary | ICD-10-CM

## 2023-06-23 LAB — URINALYSIS, ROUTINE W REFLEX MICROSCOPIC
Bilirubin, UA: NEGATIVE
Ketones, UA: NEGATIVE
Leukocytes,UA: NEGATIVE
Nitrite, UA: NEGATIVE
Protein,UA: NEGATIVE
RBC, UA: NEGATIVE
Specific Gravity, UA: 1.01 (ref 1.005–1.030)
Urobilinogen, Ur: 0.2 mg/dL (ref 0.2–1.0)
pH, UA: 6 (ref 5.0–7.5)

## 2023-06-24 ENCOUNTER — Encounter (INDEPENDENT_AMBULATORY_CARE_PROVIDER_SITE_OTHER): Payer: 59 | Admitting: Ophthalmology

## 2023-06-24 DIAGNOSIS — H43813 Vitreous degeneration, bilateral: Secondary | ICD-10-CM

## 2023-06-24 DIAGNOSIS — E113391 Type 2 diabetes mellitus with moderate nonproliferative diabetic retinopathy without macular edema, right eye: Secondary | ICD-10-CM

## 2023-06-24 DIAGNOSIS — H4312 Vitreous hemorrhage, left eye: Secondary | ICD-10-CM | POA: Diagnosis not present

## 2023-06-24 DIAGNOSIS — I10 Essential (primary) hypertension: Secondary | ICD-10-CM

## 2023-06-24 DIAGNOSIS — H35033 Hypertensive retinopathy, bilateral: Secondary | ICD-10-CM

## 2023-06-24 DIAGNOSIS — Z7984 Long term (current) use of oral hypoglycemic drugs: Secondary | ICD-10-CM | POA: Diagnosis not present

## 2023-06-24 DIAGNOSIS — Z794 Long term (current) use of insulin: Secondary | ICD-10-CM

## 2023-06-24 DIAGNOSIS — E113592 Type 2 diabetes mellitus with proliferative diabetic retinopathy without macular edema, left eye: Secondary | ICD-10-CM

## 2023-07-09 ENCOUNTER — Ambulatory Visit: Payer: 59 | Admitting: Family Medicine

## 2023-07-28 ENCOUNTER — Ambulatory Visit: Payer: 59 | Admitting: Family Medicine

## 2023-07-28 ENCOUNTER — Encounter: Payer: Self-pay | Admitting: Family Medicine

## 2023-07-28 VITALS — BP 128/72 | HR 85 | Temp 97.8°F | Ht 70.0 in | Wt 219.0 lb

## 2023-07-28 DIAGNOSIS — Z2821 Immunization not carried out because of patient refusal: Secondary | ICD-10-CM | POA: Diagnosis not present

## 2023-07-28 DIAGNOSIS — E1139 Type 2 diabetes mellitus with other diabetic ophthalmic complication: Secondary | ICD-10-CM | POA: Diagnosis not present

## 2023-07-28 DIAGNOSIS — E1169 Type 2 diabetes mellitus with other specified complication: Secondary | ICD-10-CM

## 2023-07-28 DIAGNOSIS — Z794 Long term (current) use of insulin: Secondary | ICD-10-CM | POA: Diagnosis not present

## 2023-07-28 DIAGNOSIS — E039 Hypothyroidism, unspecified: Secondary | ICD-10-CM | POA: Diagnosis not present

## 2023-07-28 DIAGNOSIS — E66811 Obesity, class 1: Secondary | ICD-10-CM

## 2023-07-28 DIAGNOSIS — N1831 Chronic kidney disease, stage 3a: Secondary | ICD-10-CM | POA: Diagnosis not present

## 2023-07-28 DIAGNOSIS — E785 Hyperlipidemia, unspecified: Secondary | ICD-10-CM

## 2023-07-28 DIAGNOSIS — E1159 Type 2 diabetes mellitus with other circulatory complications: Secondary | ICD-10-CM

## 2023-07-28 DIAGNOSIS — M109 Gout, unspecified: Secondary | ICD-10-CM

## 2023-07-28 DIAGNOSIS — I152 Hypertension secondary to endocrine disorders: Secondary | ICD-10-CM

## 2023-07-28 DIAGNOSIS — Z6831 Body mass index (BMI) 31.0-31.9, adult: Secondary | ICD-10-CM

## 2023-07-28 LAB — CBC WITH DIFFERENTIAL/PLATELET
Basophils Absolute: 0 10*3/uL (ref 0.0–0.1)
Basophils Relative: 0.4 % (ref 0.0–3.0)
Eosinophils Absolute: 0.2 10*3/uL (ref 0.0–0.7)
Eosinophils Relative: 1.6 % (ref 0.0–5.0)
HCT: 42.9 % (ref 39.0–52.0)
Hemoglobin: 14.5 g/dL (ref 13.0–17.0)
Lymphocytes Relative: 21.7 % (ref 12.0–46.0)
Lymphs Abs: 2.1 10*3/uL (ref 0.7–4.0)
MCHC: 33.8 g/dL (ref 30.0–36.0)
MCV: 89.5 fL (ref 78.0–100.0)
Monocytes Absolute: 0.8 10*3/uL (ref 0.1–1.0)
Monocytes Relative: 8.7 % (ref 3.0–12.0)
Neutro Abs: 6.5 10*3/uL (ref 1.4–7.7)
Neutrophils Relative %: 67.6 % (ref 43.0–77.0)
Platelets: 288 10*3/uL (ref 150.0–400.0)
RBC: 4.79 Mil/uL (ref 4.22–5.81)
RDW: 13.8 % (ref 11.5–15.5)
WBC: 9.6 10*3/uL (ref 4.0–10.5)

## 2023-07-28 LAB — COMPREHENSIVE METABOLIC PANEL
ALT: 42 U/L (ref 0–53)
AST: 29 U/L (ref 0–37)
Albumin: 4.7 g/dL (ref 3.5–5.2)
Alkaline Phosphatase: 61 U/L (ref 39–117)
BUN: 23 mg/dL (ref 6–23)
CO2: 31 meq/L (ref 19–32)
Calcium: 9.9 mg/dL (ref 8.4–10.5)
Chloride: 97 meq/L (ref 96–112)
Creatinine, Ser: 1.31 mg/dL (ref 0.40–1.50)
GFR: 56.84 mL/min — ABNORMAL LOW (ref >60.00)
Glucose, Bld: 317 mg/dL — ABNORMAL HIGH (ref 70–99)
Potassium: 4.1 meq/L (ref 3.5–5.1)
Sodium: 137 meq/L (ref 135–145)
Total Bilirubin: 0.3 mg/dL (ref 0.2–1.2)
Total Protein: 7.8 g/dL (ref 6.0–8.3)

## 2023-07-28 LAB — MICROALBUMIN / CREATININE URINE RATIO
Creatinine,U: 41.1 mg/dL
Microalb Creat Ratio: 8.7 mg/g (ref 0.0–30.0)
Microalb, Ur: 3.6 mg/dL — ABNORMAL HIGH (ref 0.0–1.9)

## 2023-07-28 LAB — TSH: TSH: 1.6 u[IU]/mL (ref 0.35–5.50)

## 2023-07-28 LAB — URIC ACID: Uric Acid, Serum: 6.7 mg/dL (ref 4.0–7.8)

## 2023-07-28 LAB — HEMOGLOBIN A1C: Hgb A1c MFr Bld: 9 % — ABNORMAL HIGH (ref 4.6–6.5)

## 2023-07-28 MED ORDER — BLOOD GLUCOSE TEST VI STRP: ORAL_STRIP | 6 refills | Status: AC

## 2023-07-28 MED ORDER — EZETIMIBE 10 MG PO TABS: 10.0000 mg | ORAL_TABLET | Freq: Every day | ORAL | 0 refills | Status: DC

## 2023-07-28 MED ORDER — ALLOPURINOL 300 MG PO TABS: 300.0000 mg | ORAL_TABLET | ORAL | 4 refills | Status: AC

## 2023-07-28 MED ORDER — LANCET DEVICE MISC: 0 refills | Status: AC

## 2023-07-28 MED ORDER — LANCETS MISC. MISC: 6 refills | Status: AC

## 2023-07-28 MED ORDER — BLOOD GLUCOSE MONITORING SUPPL DEVI: 0 refills | Status: AC

## 2023-07-28 NOTE — Progress Notes (Unsigned)
New Patient Office Visit  Subjective    Patient ID: Earl Harvey, male    DOB: [birth year]  Age: 68 y.o. MRN: [id number]  CC:  Chief Complaint  Patient presents with   Establish Care    Needs new meter for blood sugars, as well as refills    HPI Mischa JW VELASCO presents to establish care Last PCP in [facility] doctor- Dr. Ashley Royalty  Had laser surgery for eye condition.  Dr Harlon Flor on [place].  Needs to have cataract surgery.   Needs a new meter and testing supplies   DM- for 30+ years Stopped Januvia 2 months ago.  Taking Lantus 100 units daily  Glimepiride 2 mg bid  Jardiance 25 mg daily   Takes an herb supplement for his blood sugars.  Ultra Berberine   Takes aloph for neuropathy   Gout- no flares since allopurinol   Takes levothyroxine 125 mcg for hypothyroidism  Has been out of   Takes an herbal milk thistle      Osteobioflex for arthritis       Outpatient Encounter Medications as of 07/28/2023  Medication Sig   aspirin EC 81 MG tablet Take 81 mg by mouth daily. Swallow whole.   Blood Glucose Monitoring Suppl DEVI Use to check blood sugars. May substitute to any manufacturer covered by patient's insurance.   chlorthalidone (HYGROTON) 25 MG tablet Take 1 tablet (25 mg total) by mouth daily.   Cholecalciferol (VITAMIN D3) 125 MCG (5000 UT) TABS Take 1 tablet by mouth daily.   empagliflozin (JARDIANCE) 25 MG TABS tablet Take 1 tablet (25 mg total) by mouth daily before breakfast.   fish oil-omega-3 fatty acids 1000 MG capsule Take 2 g by mouth daily.     Garlic 500 MG TABS Take 1 tablet by mouth daily.     glimepiride (AMARYL) 2 MG tablet Take 1 tablet (2 mg total) by mouth 2 (two) times daily with a meal.   Glucose Blood (BLOOD GLUCOSE TEST STRIPS) STRP Use to check blood sugars 2 times daily. May substitute to any manufacturer covered by patient's insurance.   hydrOXYzine (ATARAX) 25 MG tablet Take 1 tablet (25 mg total) by mouth every 6 (six) hours as  needed for itching.   ibuprofen (ADVIL,MOTRIN) 800 MG tablet Take 1 tablet (800 mg total) by mouth 3 (three) times daily.   insulin glargine (LANTUS) 100 UNIT/ML Solostar Pen Inject 1 ml  under the skin   Lancet Device MISC Use to check blood sugars. May substitute to any manufacturer covered by patient's insurance.   Lancets Misc. MISC Use to check blood sugars 2 times daily. May substitute to any manufacturer covered by patient's insurance.   levothyroxine (SYNTHROID) 125 MCG tablet Take 1 tablet (125 mcg total) by mouth every morning.   losartan (COZAAR) 50 MG tablet Take 50 mg by mouth daily.   Multiple Vitamins-Minerals (MULTIVITAMINS THER. W/MINERALS) TABS Take 1 tablet by mouth daily.     triamcinolone ointment (KENALOG) 0.5 % Apply 1 Application topically 2 (two) times daily.   Turmeric (QC TUMERIC COMPLEX PO) Take by mouth daily.   [DISCONTINUED] allopurinol (ZYLOPRIM) 300 MG tablet Take 1 tablet (300 mg total) by mouth every other day.   allopurinol (ZYLOPRIM) 300 MG tablet Take 1 tablet (300 mg total) by mouth every other day.   ezetimibe (ZETIA) 10 MG tablet Take 1 tablet (10 mg total) by mouth daily.   levocetirizine (XYZAL) 5 MG tablet Take 1 tablet (5 mg  total) by mouth every evening. (Patient not taking: Reported on 07/28/2023)   [DISCONTINUED] ezetimibe (ZETIA) 10 MG tablet Take 10 mg by mouth daily. (Patient not taking: Reported on 07/28/2023)   [DISCONTINUED] sitaGLIPtin (JANUVIA) 100 MG tablet Take 1 tablet (100 mg total) by mouth daily.   No facility-administered encounter medications on file as of 07/28/2023.    Past Medical History:  Diagnosis Date   Cataract Dec.2024   Diabetes mellitus    Diverticulitis    Gout    Hypercholesteremia    Hypertension    Kidney disease    Thyroid disease     Past Surgical History:  Procedure Laterality Date   CERVICAL DISCECTOMY     EYE SURGERY     JOINT REPLACEMENT  1995    Family History  Problem Relation Age of Onset    Hyperlipidemia Mother    COPD Mother    Hypertension Father    Hyperlipidemia Father    Arthritis Father    Alcohol abuse Father    Stroke Father    Heart failure Father    Heart disease Father    Hypertension Sister    Hyperlipidemia Sister    Hypertension Brother    Hyperlipidemia Brother    Depression Brother    Cancer Brother        lung   Arthritis Brother    Anxiety disorder Brother    Alcohol abuse Brother    Epilepsy Daughter    Cancer Brother     Social History   Socioeconomic History   Marital status: Divorced    Spouse name: Not on file   Number of children: 1   Years of education: 13   Highest education level: GED or equivalent  Occupational History   Not on file  Tobacco Use   Smoking status: Former    Current packs/day: 0.00    Types: Cigarettes    Quit date: 05/07/1991    Years since quitting: 32.2   Smokeless tobacco: Never  Vaping Use   Vaping status: Never Used  Substance and Sexual Activity   Alcohol use: Not Currently    Comment: Occasionally   Drug use: No   Sexual activity: Not Currently  Other Topics Concern   Not on file  Social History Narrative   Not on file   Social Drivers of Health   Financial Resource Strain: Medium Risk (07/27/2023)   Overall Financial Resource Strain (CARDIA)    Difficulty of Paying Living Expenses: Somewhat hard  Food Insecurity: Food Insecurity Present (07/27/2023)   Hunger Vital Sign    Worried About Running Out of Food in the Last Year: Sometimes true    Ran Out of Food in the Last Year: Never true  Transportation Needs: No Transportation Needs (07/27/2023)   PRAPARE - Administrator, Civil Service (Medical): No    Lack of Transportation (Non-Medical): No  Physical Activity: Insufficiently Active (07/27/2023)   Exercise Vital Sign    Days of Exercise per Week: 2 days    Minutes of Exercise per Session: 20 min  Stress: No Stress Concern Present (07/27/2023)   [employer] of  Occupational Health - Occupational Stress Questionnaire    Feeling of Stress : Not at all  Social Connections: Socially Isolated (07/27/2023)   Social Connection and Isolation Panel [NHANES]    Frequency of Communication with Friends and Family: More than three times a week    Frequency of Social Gatherings with Friends and Family: More than three times  a week    Attends Religious Services: Never    Active Member of Clubs or Organizations: No    Attends Banker Meetings: Never    Marital Status: Divorced  Catering manager Violence: Not At Risk (02/11/2023)   Humiliation, Afraid, Rape, and Kick questionnaire    Fear of Current or Ex-Partner: No    Emotionally Abused: No    Physically Abused: No    Sexually Abused: No    Review of Systems  Constitutional:  Negative for chills, fever and malaise/fatigue.  Respiratory:  Negative for shortness of breath.   Cardiovascular:  Negative for chest pain, palpitations and leg swelling.  Gastrointestinal:  Negative for abdominal pain, constipation, diarrhea, nausea and vomiting.  Genitourinary:  Negative for dysuria, frequency and urgency.  Neurological:  Negative for dizziness and focal weakness.  Psychiatric/Behavioral:  Negative for depression. The patient is not nervous/anxious.         Objective    BP 128/72 (BP Location: Left Arm, Patient Position: Sitting, Cuff Size: Large)   Pulse 85   Temp 97.8 F (36.6 C) (Temporal)   Ht 5\' 10"  (1.778 m)   Wt 219 lb (99.3 kg)   SpO2 97%   BMI 31.42 kg/m   Physical Exam Constitutional:      General: He is not in acute distress.    Appearance: He is not ill-appearing.  Eyes:     Extraocular Movements: Extraocular movements intact.     Conjunctiva/sclera: Conjunctivae normal.  Cardiovascular:     Rate and Rhythm: Normal rate.  Pulmonary:     Effort: Pulmonary effort is normal.  Musculoskeletal:     Cervical back: Normal range of motion and neck supple.  Skin:    General:  Skin is warm and dry.  Neurological:     General: No focal deficit present.     Mental Status: He is alert and oriented to person, place, and time.  Psychiatric:        Mood and Affect: Mood normal.        Behavior: Behavior normal.        Thought Content: Thought content normal.     {Labs (Optional):23779}    Assessment & Plan:   Problem List Items Addressed This Visit     Acquired hypothyroidism   Relevant Orders   TSH   DM (diabetes mellitus) (HCC) - Primary   Relevant Orders   CBC with Differential/Platelet   Comprehensive metabolic panel   Hemoglobin A1c   Microalbumin / creatinine urine ratio   Gout   Relevant Medications   allopurinol (ZYLOPRIM) 300 MG tablet   Other Relevant Orders   Uric acid   Hyperlipidemia associated with type 2 diabetes mellitus (HCC)   Relevant Medications   Blood Glucose Monitoring Suppl DEVI   Glucose Blood (BLOOD GLUCOSE TEST STRIPS) STRP   Lancet Device MISC   Lancets Misc. MISC   ezetimibe (ZETIA) 10 MG tablet   Hypertension associated with type 2 diabetes mellitus (HCC)   Relevant Medications   ezetimibe (ZETIA) 10 MG tablet   Other Relevant Orders   CBC with Differential/Platelet   Comprehensive metabolic panel   Stage 3a chronic kidney disease (HCC)   Other Visit Diagnoses       Obesity (BMI 30.0-34.9)           No follow-ups on file.   Hetty Blend, NP-C

## 2023-07-28 NOTE — Patient Instructions (Signed)
Please go downstairs for labs before you leave.   We will be in touch with your results and recommendations.

## 2023-07-29 ENCOUNTER — Encounter: Payer: Self-pay | Admitting: Family Medicine

## 2023-07-29 ENCOUNTER — Other Ambulatory Visit: Payer: Self-pay | Admitting: Family Medicine

## 2023-07-29 DIAGNOSIS — E1165 Type 2 diabetes mellitus with hyperglycemia: Secondary | ICD-10-CM | POA: Insufficient documentation

## 2023-07-29 DIAGNOSIS — E1122 Type 2 diabetes mellitus with diabetic chronic kidney disease: Secondary | ICD-10-CM | POA: Insufficient documentation

## 2023-07-29 DIAGNOSIS — E1139 Type 2 diabetes mellitus with other diabetic ophthalmic complication: Secondary | ICD-10-CM

## 2023-07-29 DIAGNOSIS — E66811 Obesity, class 1: Secondary | ICD-10-CM | POA: Insufficient documentation

## 2023-07-29 NOTE — Progress Notes (Signed)
Please see my note to him. I will place referrals to Earl Harvey, our pharmacist and endocrinology since he is already on several diabetes medications and his A1c is 9.0%. Is he ok with this plan?

## 2023-08-04 ENCOUNTER — Telehealth: Payer: Self-pay

## 2023-08-04 NOTE — Progress Notes (Signed)
Care Guide Pharmacy Note  08/04/2023 Name: Earl Harvey MRN: [id number] DOB: Dec 03, 1955  Referred By: Avanell Shackleton, NP-C Reason for referral: Care Coordination (Outreach to schedule with pharm d )   [name] is a 68 y.o. year old male who is a primary care patient of Suezanne Jacquet, Vickie L, NP-C.  Earl Harvey was referred to the pharmacist for assistance related to: DMII  Successful contact was made with the patient to discuss pharmacy services including being ready for the pharmacist to call at least 5 minutes before the scheduled appointment time and to have medication bottles and any blood pressure readings ready for review. The patient agreed to meet with the pharmacist via telephone visit on (date/time).08/24/2023  Penne Lash , RMA     [place]  [facility] Guide  Direct Dial: [phone]  Website: [email or web address]

## 2023-08-20 ENCOUNTER — Encounter (INDEPENDENT_AMBULATORY_CARE_PROVIDER_SITE_OTHER): Payer: 59 | Admitting: Ophthalmology

## 2023-08-20 DIAGNOSIS — H2512 Age-related nuclear cataract, left eye: Secondary | ICD-10-CM

## 2023-08-20 DIAGNOSIS — Z794 Long term (current) use of insulin: Secondary | ICD-10-CM

## 2023-08-20 DIAGNOSIS — H4312 Vitreous hemorrhage, left eye: Secondary | ICD-10-CM | POA: Diagnosis not present

## 2023-08-20 DIAGNOSIS — Z7984 Long term (current) use of oral hypoglycemic drugs: Secondary | ICD-10-CM

## 2023-08-20 DIAGNOSIS — I10 Essential (primary) hypertension: Secondary | ICD-10-CM

## 2023-08-20 DIAGNOSIS — H35033 Hypertensive retinopathy, bilateral: Secondary | ICD-10-CM

## 2023-08-20 DIAGNOSIS — E113592 Type 2 diabetes mellitus with proliferative diabetic retinopathy without macular edema, left eye: Secondary | ICD-10-CM | POA: Diagnosis not present

## 2023-08-20 DIAGNOSIS — H43813 Vitreous degeneration, bilateral: Secondary | ICD-10-CM

## 2023-08-20 DIAGNOSIS — E113291 Type 2 diabetes mellitus with mild nonproliferative diabetic retinopathy without macular edema, right eye: Secondary | ICD-10-CM

## 2023-08-24 ENCOUNTER — Other Ambulatory Visit (INDEPENDENT_AMBULATORY_CARE_PROVIDER_SITE_OTHER): Payer: 59 | Admitting: Pharmacist

## 2023-08-24 DIAGNOSIS — E1165 Type 2 diabetes mellitus with hyperglycemia: Secondary | ICD-10-CM

## 2023-08-24 MED ORDER — OZEMPIC (0.25 OR 0.5 MG/DOSE) 2 MG/3ML ~~LOC~~ SOPN: PEN_INJECTOR | SUBCUTANEOUS | 0 refills | Status: DC

## 2023-08-24 MED ORDER — TRESIBA FLEXTOUCH 200 UNIT/ML ~~LOC~~ SOPN: 90.0000 [IU] | PEN_INJECTOR | Freq: Every day | SUBCUTANEOUS | 2 refills | Status: DC

## 2023-08-24 NOTE — Progress Notes (Signed)
08/24/2023 Name: Earl Harvey MRN: [id number] DOB: 17-May-1956  Chief Complaint  Patient presents with   Diabetes   Medication Management    Earl Harvey is a 68 y.o. year old male who presented for a telephone visit.   They were referred to the pharmacist by their PCP for assistance in managing diabetes.     Subjective:  Care Team: Primary Care Provider: Avanell Shackleton, NP-C ; Next Scheduled Visit: 10/26/23   Medication Access/Adherence  Current Pharmacy:  [facility] [address] ([address]), [place] - [address] [place] ([place]) [place] [address] Phone: [phone] Fax: [phone]   Patient reports affordability concerns with their medications: No  Patient reports access/transportation concerns to their pharmacy: No  Patient reports adherence concerns with their medications:  No     Diabetes:  Current medications: Lantus 100 units daily, Jardiance 25 mg daily, glimepiride 2 mg BID *He is also taking berberine and cinnamon supplements for diabetes Medications tried in the past: Trulicity (didn't work well per pt), metformin  Current glucose readings: Fasting BG 77-130s. BG in the 200s in the evening Using glucometer; testing 2 times daily *Pt notes he is not interested in CGM device at this time  Current medication access support: has Medicaid   Objective:  Lab Results  Component Value Date   HGBA1C 9.0 (H) 07/28/2023    Lab Results  Component Value Date   CREATININE 1.31 07/28/2023   BUN 23 07/28/2023   NA 137 07/28/2023   K 4.1 07/28/2023   CL 97 07/28/2023   CO2 31 07/28/2023    Lab Results  Component Value Date   CHOL 174 01/04/2023   HDL 30 (L) 01/04/2023   LDLCALC 91 01/04/2023   TRIG 321 (H) 01/04/2023   CHOLHDL 5.8 (H) 01/04/2023    Medications Reviewed Today     Reviewed by Bonita Quin, RPH (Pharmacist) on 08/24/23 at 1139  Med List Status: <None>   Medication Order Taking? Sig Documenting  Provider Last Dose Status Informant  allopurinol (ZYLOPRIM) 300 MG tablet [id number]  Take 1 tablet (300 mg total) by mouth every other day. Henson, Vickie L, NP-C  Active   aspirin EC 81 MG tablet [id number]  Take 81 mg by mouth daily. Swallow whole. [provider]  Active   Blood Glucose Monitoring Suppl DEVI [id number] Yes Use to check blood sugars. May substitute to any manufacturer covered by patient's insurance. Henson, Vickie L, NP-C Taking Active   chlorthalidone (HYGROTON) 25 MG tablet [id number]  Take 1 tablet (25 mg total) by mouth daily. Gabriel Earing, FNP  Active   Cholecalciferol (VITAMIN D3) 125 MCG (5000 UT) TABS [id number]  Take 1 tablet by mouth daily. [provider]  Active   empagliflozin (JARDIANCE) 25 MG TABS tablet [id number] Yes Take 1 tablet (25 mg total) by mouth daily before breakfast. Gabriel Earing, FNP Taking Active   ezetimibe (ZETIA) 10 MG tablet [id number]  Take 1 tablet (10 mg total) by mouth daily. Henson, Vickie L, NP-C  Active   fish oil-omega-3 fatty acids 1000 MG capsule [id number]  Take 2 g by mouth daily.   [provider]  Active Self  Garlic 500 MG TABS [id number]  Take 1 tablet by mouth daily.   [provider]  Active Self  glimepiride (AMARYL) 2 MG tablet [id number] Yes Take 1 tablet (2 mg total) by mouth 2 (two) times daily with a meal. Gabriel Earing, FNP Taking  Active   Glucose Blood (BLOOD GLUCOSE TEST STRIPS) STRP [id number]  Use to check blood sugars 2 times daily. May substitute to any manufacturer covered by patient's insurance. Henson, Vickie L, NP-C  Active   hydrOXYzine (ATARAX) 25 MG tablet [id number]  Take 1 tablet (25 mg total) by mouth every 6 (six) hours as needed for itching. Alvira Monday, MD  Active   ibuprofen (ADVIL,MOTRIN) 800 MG tablet [id number]  Take 1 tablet (800 mg total) by mouth 3 (three) times daily. Elpidio Anis, PA-C  Active   insulin glargine (LANTUS) 100 UNIT/ML Solostar Pen [id number]  Yes Inject 1 ml  under the skin  Patient taking differently: Inject 100 Units into the skin daily. Inject 1 ml  under the skin   Martina Sinner, NP Taking Active   Lancet Device MISC [id number]  Use to check blood sugars. May substitute to any manufacturer covered by patient's insurance. Henson, Vickie L, NP-C  Active   Lancets Misc. MISC [id number]  Use to check blood sugars 2 times daily. May substitute to any manufacturer covered by patient's insurance. Henson, Vickie L, NP-C  Active   levocetirizine (XYZAL) 5 MG tablet [id number]  Take 1 tablet (5 mg total) by mouth every evening.  Patient not taking: Reported on 07/28/2023   Gabriel Earing, FNP  Active   levothyroxine (SYNTHROID) 125 MCG tablet [id number]  Take 1 tablet (125 mcg total) by mouth every morning. Gabriel Earing, FNP  Active   losartan (COZAAR) 50 MG tablet [id number]  Take 50 mg by mouth daily. [provider]  Active   Multiple Vitamins-Minerals (MULTIVITAMINS THER. W/MINERALS) TABS [id number]  Take 1 tablet by mouth daily.   [provider]  Active Self  triamcinolone ointment (KENALOG) 0.5 % [id number]  Apply 1 Application topically 2 (two) times daily. Gabriel Earing, FNP  Active   Turmeric (QC TUMERIC COMPLEX PO) [id number]  Take by mouth daily. [provider]  Active               Assessment/Plan:   Diabetes: - Currently uncontrolled, BG goal <7.5% - Reviewed long term cardiovascular and renal outcomes of uncontrolled blood sugar - Reviewed goal A1c, goal fasting, and goal 2 hour post prandial glucose - Recommend to change Lantus to [place] for 200 unit/mL to reduce volume and for better duration of action. Recommend starting Ozempic 0.25 mg weekly x4 weeks then 0.5 mg SQ weekly - Recommend to check glucose BID - Tresiba dose is recommended to be reduced by 10% when switching from Lantus - Recommended pt to stop Lantus and start [place] X1 week, then start Ozempic. -  Continue Jardiance and glimepiride. Goal is to reduce basal insulin and d/c glimepiride if pt tolerates Ozempic titration    Follow Up Plan: 3/11  Arbutus Leas, PharmD, BCPS, CPP Clinical Pharmacist Practitioner [place] [facility] at [facility] [phone]

## 2023-08-24 NOTE — Patient Instructions (Signed)
It was a pleasure speaking with you today!  Stop Lantus and start Tresiba at 90 units once daily.  After 1 week on Tresiba and tolerating it well, start Ozempic 0.25 mg once weekly for 4 weeks then increase to 0.5 mg once weekly.  Continue monitoring blood sugars twice daily.  Feel free to call with any questions or concerns!  Arbutus Leas, PharmD, BCPS, CPP Clinical Pharmacist Practitioner [place] [facility] at [facility] [phone]

## 2023-08-25 ENCOUNTER — Telehealth: Payer: Self-pay | Admitting: Family Medicine

## 2023-08-25 NOTE — Telephone Encounter (Signed)
Returned patient's call. He called to let me know he won't be taking Ozempic because it has a black box warning and he won't take meds with BBW. He also states it "tears up your insides", will harm his kidneys, and has class action lawsuits. I reiterated that Ozempic is safe and has actually been shown to protect kidney function and reduce CV events. Pt will proceed with changing Lantus to [place].  Arbutus Leas, PharmD, BCPS, CPP Clinical Pharmacist Practitioner [place] [facility] [phone]

## 2023-08-25 NOTE — Telephone Encounter (Signed)
Copied from CRM [id number]. Topic: Clinical - Medical Advice >> Aug 25, 2023  9:08 AM Earl Harvey wrote: Reason for CRM:  Patient is requesting to speak to [place] in regard to Ozempic.

## 2023-08-31 ENCOUNTER — Other Ambulatory Visit: Payer: Self-pay | Admitting: Family Medicine

## 2023-08-31 MED ORDER — LOSARTAN POTASSIUM 50 MG PO TABS: 50.0000 mg | ORAL_TABLET | Freq: Every day | ORAL | 0 refills | Status: DC

## 2023-08-31 NOTE — Telephone Encounter (Signed)
 Copied from CRM [id number]. Topic: Clinical - Medication Refill >> Aug 31, 2023 10:15 AM Corin V wrote: Most Recent Primary Care Visit:  Provider: Candy Sledge R  Department: [facility]  Visit Type: PATIENT OUTREACH 60  Date: 08/24/2023  Medication: losartan (COZAAR) 50 MG tablet  Has the patient contacted their pharmacy? Yes (Agent: If no, request that the patient contact the pharmacy for the refill. If patient does not wish to contact the pharmacy document the reason why and proceed with request.) (Agent: If yes, when and what did the pharmacy advise?)  Is this the correct pharmacy for this prescription? Yes If no, delete pharmacy and type the correct one.  This is the patient's preferred pharmacy:  [facility] [address] ([address]), [place] - [address] [place] ([place]) [place] [address] Phone: [phone] Fax: [phone]   Has the prescription been filled recently? No  Is the patient out of the medication? Yes  Has the patient been seen for an appointment in the last year OR does the patient have an upcoming appointment? Yes  Can we respond through MyChart? No  Agent: Please be advised that Rx refills may take up to 3 business days. We ask that you follow-up with your pharmacy.

## 2023-09-13 NOTE — Progress Notes (Unsigned)
 09/14/2023 Name: Earl Harvey MRN: [id number] DOB: 1956/01/13  Chief Complaint  Patient presents with   Diabetes   Medication Management    Earl Harvey is a 68 y.o. year old male who presented for a telephone visit.   They were referred to the pharmacist by their PCP for assistance in managing diabetes.   Subjective:  Care Team: Primary Care Provider: Avanell Shackleton, NP-C ; Next Scheduled Visit: 10/26/23   Medication Access/Adherence  Current Pharmacy:  [facility] [address] ([address] [place] - [address] [place] ([place]) [place] [address] Phone: [phone] Fax: [phone]   Patient reports affordability concerns with their medications: No  Patient reports access/transportation concerns to their pharmacy: No  Patient reports adherence concerns with their medications:  No    Pt notes he received a letter from his insurance that [place] will not be covered on next refill  Diabetes:  Current medications: Tresiba 90 units daily, Jardiance 25 mg daily, glimepiride 2 mg BID *He is also taking berberine and cinnamon supplements for diabetes Medications tried in the past: Trulicity (didn't work well per pt), metformin, lantus (to reduce volume and for better duration of action - was switched to tresiba)  Last appt recommended ozempic, however pt refused due to side effects/what he read online  Current glucose readings: Fasting BG 155 yesterday, this am 88 but notes around 130 on average. Notes evening BG sometimes up in the 200s  Using glucometer; testing 2 times daily *Pt notes he is not interested in CGM device at this time  Current medication access support: has Medicaid  *Reported genital yeast infection that has gotten progressively worse- wondering if due to [place]. Was unable to say when symptoms started.   Objective:  Lab Results  Component Value Date   HGBA1C 9.0 (H) 07/28/2023    Lab Results  Component Value Date    CREATININE 1.31 07/28/2023   BUN 23 07/28/2023   NA 137 07/28/2023   K 4.1 07/28/2023   CL 97 07/28/2023   CO2 31 07/28/2023    Lab Results  Component Value Date   CHOL 174 01/04/2023   HDL 30 (L) 01/04/2023   LDLCALC 91 01/04/2023   TRIG 321 (H) 01/04/2023   CHOLHDL 5.8 (H) 01/04/2023    Medications Reviewed Today     Reviewed by Bonita Quin, RPH (Pharmacist) on 08/24/23 at 1139  Med List Status: <None>   Medication Order Taking? Sig Documenting Provider Last Dose Status Informant  allopurinol (ZYLOPRIM) 300 MG tablet [id number]  Take 1 tablet (300 mg total) by mouth every other day. Henson, Vickie L, NP-C  Active   aspirin EC 81 MG tablet [id number]  Take 81 mg by mouth daily. Swallow whole. [provider]  Active   Blood Glucose Monitoring Suppl DEVI [id number] Yes Use to check blood sugars. May substitute to any manufacturer covered by patient's insurance. Henson, Vickie L, NP-C Taking Active   chlorthalidone (HYGROTON) 25 MG tablet [id number]  Take 1 tablet (25 mg total) by mouth daily. Gabriel Earing, FNP  Active   Cholecalciferol (VITAMIN D3) 125 MCG (5000 UT) TABS [id number]  Take 1 tablet by mouth daily. [provider]  Active   empagliflozin (JARDIANCE) 25 MG TABS tablet [id number] Yes Take 1 tablet (25 mg total) by mouth daily before breakfast. Gabriel Earing, FNP Taking Active   ezetimibe (ZETIA) 10 MG tablet [id number]  Take 1 tablet (10 mg total) by mouth daily.  Henson, Vickie L, NP-C  Active   fish oil-omega-3 fatty acids 1000 MG capsule [id number]  Take 2 g by mouth daily.   [provider]  Active Self  Garlic 500 MG TABS [id number]  Take 1 tablet by mouth daily.   [provider]  Active Self  glimepiride (AMARYL) 2 MG tablet [id number] Yes Take 1 tablet (2 mg total) by mouth 2 (two) times daily with a meal. Gabriel Earing, FNP Taking Active   Glucose Blood (BLOOD GLUCOSE TEST STRIPS) STRP [id number]  Use to check blood  sugars 2 times daily. May substitute to any manufacturer covered by patient's insurance. Henson, Vickie L, NP-C  Active   hydrOXYzine (ATARAX) 25 MG tablet [id number]  Take 1 tablet (25 mg total) by mouth every 6 (six) hours as needed for itching. Alvira Monday, MD  Active   ibuprofen (ADVIL,MOTRIN) 800 MG tablet [id number]  Take 1 tablet (800 mg total) by mouth 3 (three) times daily. Elpidio Anis, PA-C  Active   insulin glargine (LANTUS) 100 UNIT/ML Solostar Pen [id number] Yes Inject 1 ml  under the skin  Patient taking differently: Inject 100 Units into the skin daily. Inject 1 ml  under the skin   Martina Sinner, NP Taking Active   Lancet Device MISC [id number]  Use to check blood sugars. May substitute to any manufacturer covered by patient's insurance. Henson, Vickie L, NP-C  Active   Lancets Misc. MISC [id number]  Use to check blood sugars 2 times daily. May substitute to any manufacturer covered by patient's insurance. Henson, Vickie L, NP-C  Active   levocetirizine (XYZAL) 5 MG tablet [id number]  Take 1 tablet (5 mg total) by mouth every evening.  Patient not taking: Reported on 07/28/2023   Gabriel Earing, FNP  Active   levothyroxine (SYNTHROID) 125 MCG tablet [id number]  Take 1 tablet (125 mcg total) by mouth every morning. Gabriel Earing, FNP  Active   losartan (COZAAR) 50 MG tablet [id number]  Take 50 mg by mouth daily. [provider]  Active   Multiple Vitamins-Minerals (MULTIVITAMINS THER. W/MINERALS) TABS [id number]  Take 1 tablet by mouth daily.   [provider]  Active Self  triamcinolone ointment (KENALOG) 0.5 % [id number]  Apply 1 Application topically 2 (two) times daily. Gabriel Earing, FNP  Active   Turmeric (QC TUMERIC COMPLEX PO) [id number]  Take by mouth daily. [provider]  Active               Assessment/Plan:   Diabetes: - Currently uncontrolled, BG goal <7.5% - Reviewed long term cardiovascular and renal outcomes of  uncontrolled blood sugar - Reviewed goal A1c, goal fasting, and goal 2 hour post prandial glucose - Since insurance will not cover [place] on next fill - pt will complete his supply and increase to [place] 95 units daily. When he finished, switch back to Lantus at 50 units twice daily (was previously on 100 units daily) - Recommend to check glucose BID - Recommended miconazole 2% cream OTC (Vagisil cream) to yeast infected area twice daily for at least 1 week. If the area becomes more red, swollen, or painful despite OTC treatment, call office to make an appointment for evaluation. - Discussed that yeast infection can also be caused by elevated BG. He has been on [place] >1 year without previous issue. - If require d/c of jardiance, I recommended reconsidering Ozempic and to discuss his concerns with this medication with PCP at upcoming 4/22 appt to  get a second opinion. If still not willing to take Ozempic, then next step would be prandial insulin. - Need A1c recheck at 4/22 appt   Follow Up Plan: f/u after PCP appt   Arbutus Leas, PharmD, BCPS, CPP Clinical Pharmacist Practitioner [place] [facility] at [facility] [phone]

## 2023-09-14 ENCOUNTER — Other Ambulatory Visit (INDEPENDENT_AMBULATORY_CARE_PROVIDER_SITE_OTHER): Payer: 59

## 2023-09-14 DIAGNOSIS — E1165 Type 2 diabetes mellitus with hyperglycemia: Secondary | ICD-10-CM

## 2023-09-14 MED ORDER — LANTUS SOLOSTAR 100 UNIT/ML ~~LOC~~ SOPN: 50.0000 [IU] | PEN_INJECTOR | Freq: Two times a day (BID) | SUBCUTANEOUS | 99 refills | Status: DC

## 2023-09-14 MED ORDER — TRESIBA FLEXTOUCH 200 UNIT/ML ~~LOC~~ SOPN: 95.0000 [IU] | PEN_INJECTOR | Freq: Every day | SUBCUTANEOUS | Status: DC

## 2023-09-14 NOTE — Patient Instructions (Signed)
 It was a pleasure speaking with you today!  Increase Tresiba to 95 units daily. Once you have completed [place] supply, it is okay to go back to Lantus however take 50 units twice daily.  Try over the counter miconazole 2%  (Vagisil) cream applied to affected area twice daily for at least 1 week. If the area worsens in regards to redness, swelling, or pain, please call the office to schedule an appointment to be evaluated.  Feel free to call with any questions or concerns!  Arbutus Leas, PharmD, BCPS, CPP Clinical Pharmacist Practitioner [place] [facility] at [facility] [phone]

## 2023-09-17 ENCOUNTER — Telehealth: Payer: Self-pay | Admitting: Urology

## 2023-09-17 NOTE — Telephone Encounter (Signed)
 called and lvm to call back to r/s apt with Dr. Retta Diones due to him being out of office 09/17/23 ANN

## 2023-09-17 NOTE — Telephone Encounter (Signed)
 Marland Kitchen

## 2023-09-30 ENCOUNTER — Other Ambulatory Visit: Payer: Self-pay | Admitting: Family Medicine

## 2023-09-30 MED ORDER — CHLORTHALIDONE 25 MG PO TABS
25.0000 mg | ORAL_TABLET | Freq: Every day | ORAL | 1 refills | Status: DC
Start: 2023-09-30 — End: 2024-02-23

## 2023-09-30 NOTE — Telephone Encounter (Signed)
 Copied from CRM [id number]. Topic: Clinical - Medication Refill >> Sep 30, 2023 12:03 PM Almira Coaster wrote: Most Recent Primary Care Visit:  Provider: Candy Sledge R  Department: [facility]  Visit Type: PATIENT OUTREACH 30  Date: 09/14/2023  Medication: chlorthalidone (HYGROTON) 25 MG tablet  Has the patient contacted their pharmacy? Yes (Agent: If no, request that the patient contact the pharmacy for the refill. If patient does not wish to contact the pharmacy document the reason why and proceed with request.) (Agent: If yes, when and what did the pharmacy advise?)  Is this the correct pharmacy for this prescription? Yes If no, delete pharmacy and type the correct one.  This is the patient's preferred pharmacy:  [facility] [address] ([address] [place] - [address] [place] ([place]) [place] [address] Phone: [phone] Fax: [phone]   Has the prescription been filled recently? No  Is the patient out of the medication? No  Has the patient been seen for an appointment in the last year OR does the patient have an upcoming appointment? Yes  Can we respond through MyChart? Yes  Agent: Please be advised that Rx refills may take up to 3 business days. We ask that you follow-up with your pharmacy.

## 2023-10-14 ENCOUNTER — Other Ambulatory Visit: Payer: Self-pay | Admitting: Family Medicine

## 2023-10-19 ENCOUNTER — Other Ambulatory Visit: Payer: Self-pay | Admitting: Family Medicine

## 2023-10-19 MED ORDER — LEVOTHYROXINE SODIUM 125 MCG PO TABS
125.0000 ug | ORAL_TABLET | Freq: Every morning | ORAL | 1 refills | Status: DC
Start: 2023-10-19 — End: 2024-04-19

## 2023-10-19 NOTE — Telephone Encounter (Signed)
 Copied from CRM [id number]. Topic: Clinical - Medication Refill >> Oct 19, 2023  3:17 PM [place] A wrote: Most Recent Primary Care Visit:  Provider: Lawana Pray R  Department: [facility]  Visit Type: PATIENT OUTREACH 30  Date: 09/14/2023  Medication: levothyroxine (SYNTHROID) 125 MCG tablet  Has the patient contacted their pharmacy? Yes (Agent: If no, request that the patient contact the pharmacy for the refill. If patient does not wish to contact the pharmacy document the reason why and proceed with request.) (Agent: If yes, when and what did the pharmacy advise?)  Is this the correct pharmacy for this prescription? Yes If no, delete pharmacy and type the correct one.  This is the patient's preferred pharmacy:  [facility] [address] ([address]), [place] - [address] [place] ([place]) [place] [address] Phone: [phone] Fax: [phone]   Has the prescription been filled recently? No  Is the patient out of the medication? No  Has the patient been seen for an appointment in the last year OR does the patient have an upcoming appointment? Yes  Can we respond through MyChart? Yes  Agent: Please be advised that Rx refills may take up to 3 business days. We ask that you follow-up with your pharmacy.

## 2023-10-21 ENCOUNTER — Telehealth: Payer: Self-pay | Admitting: Family Medicine

## 2023-10-21 DIAGNOSIS — Z794 Long term (current) use of insulin: Secondary | ICD-10-CM

## 2023-10-21 NOTE — Telephone Encounter (Unsigned)
 Copied from CRM [id number]. Topic: Clinical - Medication Refill >> Oct 21, 2023  9:26 AM Dimple Francis wrote: Most Recent Primary Care Visit:  Provider: Lawana Pray R  Department: [facility]  Visit Type: PATIENT OUTREACH 30  Date: 09/14/2023  Medication: empagliflozin (JARDIANCE) 25 MG TABS tablet  Has the patient contacted their pharmacy? Yes (Agent: If no, request that the patient contact the pharmacy for the refill. If patient does not wish to contact the pharmacy document the reason why and proceed with request.) (Agent: If yes, when and what did the pharmacy advise?)  Is this the correct pharmacy for this prescription? Yes If no, delete pharmacy and type the correct one.  This is the patient's preferred pharmacy:  [facility] [address] ([address]), [place] - [address] [place] ([place]) [place] [address] Phone: [phone] Fax: [phone]   Has the prescription been filled recently? Yes  Is the patient out of the medication? Yes  Has the patient been seen for an appointment in the last year OR does the patient have an upcoming appointment? Yes  Can we respond through MyChart? Yes  Agent: Please be advised that Rx refills may take up to 3 business days. We ask that you follow-up with your pharmacy.

## 2023-10-26 ENCOUNTER — Encounter: Payer: Self-pay | Admitting: Family Medicine

## 2023-10-26 ENCOUNTER — Ambulatory Visit (INDEPENDENT_AMBULATORY_CARE_PROVIDER_SITE_OTHER): Payer: 59 | Admitting: Family Medicine

## 2023-10-26 VITALS — BP 130/72 | HR 85 | Temp 97.8°F | Ht 70.0 in | Wt 219.0 lb

## 2023-10-26 DIAGNOSIS — E1159 Type 2 diabetes mellitus with other circulatory complications: Secondary | ICD-10-CM

## 2023-10-26 DIAGNOSIS — Z794 Long term (current) use of insulin: Secondary | ICD-10-CM | POA: Diagnosis not present

## 2023-10-26 DIAGNOSIS — Z2821 Immunization not carried out because of patient refusal: Secondary | ICD-10-CM

## 2023-10-26 DIAGNOSIS — R208 Other disturbances of skin sensation: Secondary | ICD-10-CM

## 2023-10-26 DIAGNOSIS — E039 Hypothyroidism, unspecified: Secondary | ICD-10-CM

## 2023-10-26 DIAGNOSIS — I152 Hypertension secondary to endocrine disorders: Secondary | ICD-10-CM

## 2023-10-26 DIAGNOSIS — I825Y2 Chronic embolism and thrombosis of unspecified deep veins of left proximal lower extremity: Secondary | ICD-10-CM | POA: Diagnosis not present

## 2023-10-26 DIAGNOSIS — E1169 Type 2 diabetes mellitus with other specified complication: Secondary | ICD-10-CM

## 2023-10-26 DIAGNOSIS — E785 Hyperlipidemia, unspecified: Secondary | ICD-10-CM

## 2023-10-26 DIAGNOSIS — E1165 Type 2 diabetes mellitus with hyperglycemia: Secondary | ICD-10-CM

## 2023-10-26 DIAGNOSIS — Z1159 Encounter for screening for other viral diseases: Secondary | ICD-10-CM | POA: Diagnosis not present

## 2023-10-26 DIAGNOSIS — E1122 Type 2 diabetes mellitus with diabetic chronic kidney disease: Secondary | ICD-10-CM

## 2023-10-26 DIAGNOSIS — Z1211 Encounter for screening for malignant neoplasm of colon: Secondary | ICD-10-CM

## 2023-10-26 LAB — COMPREHENSIVE METABOLIC PANEL WITH GFR
ALT: 39 U/L (ref 0–53)
AST: 27 U/L (ref 0–37)
Albumin: 4.6 g/dL (ref 3.5–5.2)
Alkaline Phosphatase: 56 U/L (ref 39–117)
BUN: 29 mg/dL — ABNORMAL HIGH (ref 6–23)
CO2: 26 meq/L (ref 19–32)
Calcium: 10 mg/dL (ref 8.4–10.5)
Chloride: 101 meq/L (ref 96–112)
Creatinine, Ser: 1.21 mg/dL (ref 0.40–1.50)
GFR: 62.41 mL/min (ref >60.00)
Glucose, Bld: 170 mg/dL — ABNORMAL HIGH (ref 70–99)
Potassium: 3.9 meq/L (ref 3.5–5.1)
Sodium: 137 meq/L (ref 135–145)
Total Bilirubin: 0.4 mg/dL (ref 0.2–1.2)
Total Protein: 7.9 g/dL (ref 6.0–8.3)

## 2023-10-26 LAB — CBC WITH DIFFERENTIAL/PLATELET
Basophils Absolute: 0 10*3/uL (ref 0.0–0.1)
Basophils Relative: 0.4 % (ref 0.0–3.0)
Eosinophils Absolute: 0.2 10*3/uL (ref 0.0–0.7)
Eosinophils Relative: 2 % (ref 0.0–5.0)
HCT: 44.7 % (ref 39.0–52.0)
Hemoglobin: 14.8 g/dL (ref 13.0–17.0)
Lymphocytes Relative: 25 % (ref 12.0–46.0)
Lymphs Abs: 2.2 10*3/uL (ref 0.7–4.0)
MCHC: 33.2 g/dL (ref 30.0–36.0)
MCV: 88.4 fl (ref 78.0–100.0)
Monocytes Absolute: 0.8 10*3/uL (ref 0.1–1.0)
Monocytes Relative: 9.2 % (ref 3.0–12.0)
Neutro Abs: 5.5 10*3/uL (ref 1.4–7.7)
Neutrophils Relative %: 63.4 % (ref 43.0–77.0)
Platelets: 277 10*3/uL (ref 150.0–400.0)
RBC: 5.06 Mil/uL (ref 4.22–5.81)
RDW: 13.7 % (ref 11.5–15.5)
WBC: 8.7 10*3/uL (ref 4.0–10.5)

## 2023-10-26 LAB — LIPID PANEL
Cholesterol: 204 mg/dL — ABNORMAL HIGH (ref 0–200)
HDL: 31.1 mg/dL — ABNORMAL LOW (ref >39.00)
NonHDL: 173.13
Total CHOL/HDL Ratio: 7
Triglycerides: 437 mg/dL — ABNORMAL HIGH (ref 0.0–149.0)
VLDL: 87.4 mg/dL — ABNORMAL HIGH (ref 0.0–40.0)

## 2023-10-26 LAB — TSH: TSH: 1.42 u[IU]/mL (ref 0.35–5.50)

## 2023-10-26 LAB — HEMOGLOBIN A1C: Hgb A1c MFr Bld: 9.2 % — ABNORMAL HIGH (ref 4.6–6.5)

## 2023-10-26 LAB — LDL CHOLESTEROL, DIRECT: Direct LDL: 121 mg/dL

## 2023-10-26 MED ORDER — INSULIN LISPRO 100 UNIT/ML IJ SOLN: 5.0000 [IU] | Freq: Every day | INTRAMUSCULAR | 0 refills | Status: DC

## 2023-10-26 NOTE — Assessment & Plan Note (Signed)
 Check fasting lipids and follow-up.  Reports taking Zetia  daily.

## 2023-10-26 NOTE — Assessment & Plan Note (Addendum)
 Decreased sensation of bilateral lower extremities up to his knees.  This is chronic.  He denies pain.  No ulcerations.  He has good pedal pulses.  Reviewed Dr. Nanci Ax note from August 2024.  Patient has chronic DVT of left leg.  Anticoagulation not needed per patient.  Discussed footcare and better control of diabetes.

## 2023-10-26 NOTE — Assessment & Plan Note (Signed)
 Continue levothyroxine  and adjust dose as warranted.  Check TSH

## 2023-10-26 NOTE — Assessment & Plan Note (Signed)
 Advised to control blood sugars to prevent worsening disease.  Continue to monitor.  Check renal function today.

## 2023-10-26 NOTE — Assessment & Plan Note (Signed)
 Previous A1c 9.0%.  Blood sugars have been mostly elevated with readings higher than 200 fasting at times.  He has also had some blood sugars in the 80s to 130s some days. Add Humalog  5 units with largest meal of the day. Continue Lantus  100 units daily.  Continue glimepiride .  Prefers to stop Jardiance  due to urinary itching.  Denies perineal rash or pain. Declines starting GLP-1.  Reports taking Trulicity in the past without any significant side effects. Reports taking Januvia  in the past without side effects but prefers to not restart this medication.   Pharmacist met with patient during office visit and applied Dexcom.  She will meet with him via telephone next week. Appointment with endocrinology in May.  Advised him to keep this appointment.  Appreciate them taking over his diabetes and hopeful for better control.  Reports being up-to-date with eye exam. Declines pneumonia vaccine.

## 2023-10-26 NOTE — Assessment & Plan Note (Signed)
 Controlled.  Continue current medication.  Continue low-sodium diet and walking daily.

## 2023-10-26 NOTE — Progress Notes (Signed)
 He is not currently on cholesterol medication such as Crestor or Lipitor.  Please find out if he has taken one of these in the past and if he is willing to start taking one now to lower his cholesterol and reduce his risk for heart disease, heart attack and stroke.  His triglycerides were significantly elevated as well.  Please ask him if he had had anything to drink or eat this morning other than water.

## 2023-10-26 NOTE — Assessment & Plan Note (Signed)
 Not on anticoagulation.  Patient reports he was told he could stop his anticoagulant after he took it for 1 year.  No new clots per patient.  Advise follow-up with Dr. Kermit Ped as recommended.

## 2023-10-26 NOTE — Progress Notes (Signed)
 Subjective:     Patient ID: Earl Harvey, male    DOB: [birth year], 68 y.o.   MRN: [id number]  Chief Complaint  Patient presents with   Medical Management of Chronic Issues    Fasting, 3 month f/u    HPI  History of Present Illness          Here to follow up on chronic health conditions.   DM- last A1c 9.0%  FBS 176 today but has been as low as 86 and over 200 some days.   Taking glimepiride  daily  100 units Lantus  daily in the morning  Jardiance  has been causing itching with urination and he prefers to stop the medication.   He does not want to start GLP-1. He took Trulicity in the past. Denies side effects.   Taking OTC Barberine   Dr. Augustus Ledger is his eye doctor. Eye exam in UTD   Reports hx of DVT in his LLE. Last saw Dr. Shaunna Delaware in August 2024.   He is not on anticoagulant. States he was advised he could stop the medication after one year.    Urologist appt in June    Health Maintenance Due  Topic Date Due   Hepatitis C Screening  Never done   Zoster Vaccines- Shingrix (1 of 2) Never done   Pneumonia Vaccine 54+ Years old (3 of 3 - PCV) 06/14/2014   OPHTHALMOLOGY EXAM  07/21/2023   Colonoscopy  01/30/2024    Past Medical History:  Diagnosis Date   Cataract Dec.2024   Diabetes mellitus    Diverticulitis    Gout    Hypercholesteremia    Hypertension    Kidney disease    Left foot pain 05/29/2022   Rash 07/30/2022   Thyroid  disease     Past Surgical History:  Procedure Laterality Date   CERVICAL DISCECTOMY     EYE SURGERY     JOINT REPLACEMENT  1995    Family History  Problem Relation Age of Onset   Hyperlipidemia Mother    COPD Mother    Hypertension Father    Hyperlipidemia Father    Arthritis Father    Alcohol abuse Father    Stroke Father    Heart failure Father    Heart disease Father    Hypertension Sister    Hyperlipidemia Sister    Hypertension Brother    Hyperlipidemia Brother    Depression Brother    Cancer Brother         lung   Arthritis Brother    Anxiety disorder Brother    Alcohol abuse Brother    Epilepsy Daughter    Cancer Brother     Social History   Socioeconomic History   Marital status: Divorced    Spouse name: Not on file   Number of children: 1   Years of education: 13   Highest education level: GED or equivalent  Occupational History   Not on file  Tobacco Use   Smoking status: Former    Current packs/day: 0.00    Types: Cigarettes    Quit date: 05/07/1991    Years since quitting: 32.4   Smokeless tobacco: Never  Vaping Use   Vaping status: Never Used  Substance and Sexual Activity   Alcohol use: Not Currently    Comment: Occasionally   Drug use: No   Sexual activity: Not Currently  Other Topics Concern   Not on file  Social History Narrative   Not on file   Social Drivers  of Health   Financial Resource Strain: Medium Risk (07/27/2023)   Overall Financial Resource Strain (CARDIA)    Difficulty of Paying Living Expenses: Somewhat hard  Food Insecurity: Food Insecurity Present (07/27/2023)   Hunger Vital Sign    Worried About Running Out of Food in the Last Year: Sometimes true    Ran Out of Food in the Last Year: Never true  Transportation Needs: No Transportation Needs (07/27/2023)   PRAPARE - Administrator, Civil Service (Medical): No    Lack of Transportation (Non-Medical): No  Physical Activity: Insufficiently Active (07/27/2023)   Exercise Vital Sign    Days of Exercise per Week: 2 days    Minutes of Exercise per Session: 20 min  Stress: No Stress Concern Present (07/27/2023)   [employer] of Occupational Health - Occupational Stress Questionnaire    Feeling of Stress : Not at all  Social Connections: Socially Isolated (07/27/2023)   Social Connection and Isolation Panel [NHANES]    Frequency of Communication with Friends and Family: More than three times a week    Frequency of Social Gatherings with Friends and Family: More than three times a  week    Attends Religious Services: Never    Database administrator or Organizations: No    Attends Banker Meetings: Never    Marital Status: Divorced  Catering manager Violence: Not At Risk (02/11/2023)   Humiliation, Afraid, Rape, and Kick questionnaire    Fear of Current or Ex-Partner: No    Emotionally Abused: No    Physically Abused: No    Sexually Abused: No    Outpatient Medications Prior to Visit  Medication Sig Dispense Refill   allopurinol  (ZYLOPRIM ) 300 MG tablet Take 1 tablet (300 mg total) by mouth every other day. 30 tablet 4   aspirin EC 81 MG tablet Take 81 mg by mouth daily. Swallow whole.     Blood Glucose Monitoring Suppl DEVI Use to check blood sugars. May substitute to any manufacturer covered by patient's insurance. 1 each 0   chlorthalidone  (HYGROTON ) 25 MG tablet Take 1 tablet (25 mg total) by mouth daily. 90 tablet 1   Cholecalciferol (VITAMIN D3) 125 MCG (5000 UT) TABS Take 1 tablet by mouth daily.     empagliflozin  (JARDIANCE ) 25 MG TABS tablet Take 1 tablet (25 mg total) by mouth daily before breakfast. 90 tablet 3   ezetimibe  (ZETIA ) 10 MG tablet Take 1 tablet by mouth once daily 90 tablet 0   fish oil-omega-3 fatty acids 1000 MG capsule Take 2 g by mouth daily.       Garlic 500 MG TABS Take 1 tablet by mouth daily.       glimepiride  (AMARYL ) 2 MG tablet Take 1 tablet (2 mg total) by mouth 2 (two) times daily with a meal. 180 tablet 3   Glucose Blood (BLOOD GLUCOSE TEST STRIPS) STRP Use to check blood sugars 2 times daily. May substitute to any manufacturer covered by patient's insurance. 200 strip 6   hydrOXYzine  (ATARAX ) 25 MG tablet Take 1 tablet (25 mg total) by mouth every 6 (six) hours as needed for itching. 12 tablet 0   ibuprofen  (ADVIL ,MOTRIN ) 800 MG tablet Take 1 tablet (800 mg total) by mouth 3 (three) times daily. 21 tablet 0   insulin  glargine (LANTUS  SOLOSTAR) 100 UNIT/ML Solostar Pen Inject 50 Units into the skin 2 (two) times daily.  Pt to start Lantus  after he finishes his supply of Tresiba  15 mL PRN  Lancet Device MISC Use to check blood sugars. May substitute to any manufacturer covered by patient's insurance. 1 each 0   Lancets Misc. MISC Use to check blood sugars 2 times daily. May substitute to any manufacturer covered by patient's insurance. 200 each 6   levocetirizine (XYZAL ) 5 MG tablet Take 1 tablet (5 mg total) by mouth every evening. 90 tablet 3   levothyroxine  (SYNTHROID ) 125 MCG tablet Take 1 tablet (125 mcg total) by mouth every morning. 90 tablet 1   losartan  (COZAAR ) 50 MG tablet Take 1 tablet (50 mg total) by mouth daily. 90 tablet 0   Multiple Vitamins-Minerals (MULTIVITAMINS THER. W/MINERALS) TABS Take 1 tablet by mouth daily.       triamcinolone  ointment (KENALOG ) 0.5 % Apply 1 Application topically 2 (two) times daily. 30 g 0   Turmeric (QC TUMERIC COMPLEX PO) Take by mouth daily.     insulin  degludec (TRESIBA  FLEXTOUCH) 200 UNIT/ML FlexTouch Pen Inject 96 Units into the skin daily.     No facility-administered medications prior to visit.    No Known Allergies  Review of Systems  Constitutional:  Negative for chills, fever and malaise/fatigue.  Eyes:  Negative for blurred vision and double vision.  Respiratory:  Negative for cough and shortness of breath.   Cardiovascular:  Negative for chest pain, palpitations, claudication and leg swelling.  Gastrointestinal:  Negative for abdominal pain, constipation, diarrhea, nausea and vomiting.  Genitourinary:  Positive for frequency. Negative for dysuria and urgency.  Musculoskeletal:  Negative for falls, joint pain and myalgias.  Neurological:  Negative for dizziness, focal weakness and headaches.       Objective:    Physical Exam Constitutional:      General: He is not in acute distress.    Appearance: He is not ill-appearing.  HENT:     Mouth/Throat:     Mouth: Mucous membranes are moist.  Eyes:     Extraocular Movements: Extraocular  movements intact.     Conjunctiva/sclera: Conjunctivae normal.  Cardiovascular:     Rate and Rhythm: Normal rate and regular rhythm.  Pulmonary:     Effort: Pulmonary effort is normal.     Breath sounds: Normal breath sounds.  Musculoskeletal:     Cervical back: Normal range of motion and neck supple.     Right lower leg: No edema.     Left lower leg: No edema.  Skin:    General: Skin is warm and dry.  Neurological:     General: No focal deficit present.     Mental Status: He is alert and oriented to person, place, and time.     Cranial Nerves: No facial asymmetry.     Sensory: Sensory deficit present.     Motor: No weakness.     Coordination: Coordination normal.     Gait: Gait normal.  Psychiatric:        Mood and Affect: Mood normal.        Behavior: Behavior normal.        Thought Content: Thought content normal.      BP 130/72 (BP Location: Left Arm, Patient Position: Sitting)   Pulse 85   Temp 97.8 F (36.6 C) (Temporal)   Ht 5\' 10"  (1.778 m)   Wt 219 lb (99.3 kg)   SpO2 98%   BMI 31.42 kg/m  Wt Readings from Last 3 Encounters:  10/26/23 219 lb (99.3 kg)  07/28/23 219 lb (99.3 kg)  04/16/23 218 lb (98.9 kg)  Assessment & Plan:   Problem List Items Addressed This Visit     Acquired hypothyroidism   Continue levothyroxine  and adjust dose as warranted.  Check TSH      Relevant Orders   TSH   Chronic kidney disease due to type 2 diabetes mellitus (HCC)   Advised to control blood sugars to prevent worsening disease.  Continue to monitor.  Check renal function today.      Relevant Medications   insulin  lispro (HUMALOG ) 100 UNIT/ML injection   Decreased sensation of lower extremity   Decreased sensation of bilateral lower extremities up to his knees.  This is chronic.  He denies pain.  No ulcerations.  He has good pedal pulses.  Reviewed Dr. Nanci Ax note from August 2024.  Patient has chronic DVT of left leg.  Anticoagulation not needed per  patient.  Discussed footcare and better control of diabetes.      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  Patient reports he was told he could stop his anticoagulant after he took it for 1 year.  No new clots per patient.  Advise follow-up with Dr. Kermit Ped as recommended.      Hyperlipidemia associated with type 2 diabetes mellitus (HCC)   Check fasting lipids and follow-up.  Reports taking Zetia  daily.      Relevant Medications   insulin  lispro (HUMALOG ) 100 UNIT/ML injection   Other Relevant Orders   Lipid panel   Hypertension associated with type 2 diabetes mellitus (HCC)   Controlled.  Continue current medication.  Continue low-sodium diet and walking daily.      Relevant Medications   insulin  lispro (HUMALOG ) 100 UNIT/ML injection   Other Relevant Orders   CBC with Differential/Platelet   Comprehensive metabolic panel with GFR   Uncontrolled type 2 diabetes mellitus with hyperglycemia (HCC) - Primary   Previous A1c 9.0%.  Blood sugars have been mostly elevated with readings higher than 200 fasting at times.  He has also had some blood sugars in the 80s to 130s some days. Add Humalog  5 units with largest meal of the day. Continue Lantus  100 units daily.  Continue glimepiride .  Prefers to stop Jardiance  due to urinary itching.  Denies perineal rash or pain. Declines starting GLP-1.  Reports taking Trulicity in the past without any significant side effects. Reports taking Januvia  in the past without side effects but prefers to not restart this medication.   Pharmacist met with patient during office visit and applied Dexcom.  She will meet with him via telephone next week. Appointment with endocrinology in May.  Advised him to keep this appointment.  Appreciate them taking over his diabetes and hopeful for better control.  Reports being up-to-date with eye exam. Declines pneumonia vaccine.      Relevant Medications    insulin  lispro (HUMALOG ) 100 UNIT/ML injection   Other Relevant Orders   CBC with Differential/Platelet   Comprehensive metabolic panel with GFR   Hemoglobin A1c   Other Visit Diagnoses       Immunization declined         Encounter for screening for other viral diseases       Relevant Orders   Hepatitis C antibody     Screen for colon cancer       Relevant Orders   Ambulatory referral to Gastroenterology       Royanne Core, [facility], also met with patient while in office. Applied Dexcom. Patient agreed to  try CGM.   I have discontinued Earl Harvey's Tresiba  FlexTouch. I am also having him start on insulin  lispro. Additionally, I am having him maintain his fish oil-omega-3 fatty acids, Garlic, multivitamins ther. w/minerals, ibuprofen , Turmeric (QC TUMERIC COMPLEX PO), Vitamin D3, aspirin EC, empagliflozin , triamcinolone  ointment, glimepiride , levocetirizine, hydrOXYzine , Blood Glucose Monitoring Suppl, BLOOD GLUCOSE TEST STRIPS, Lancet Device, Lancets Misc., allopurinol , losartan , Lantus  SoloStar, chlorthalidone , ezetimibe , and levothyroxine .  Meds ordered this encounter  Medications   insulin  lispro (HUMALOG ) 100 UNIT/ML injection    Sig: Inject 0.05 mLs (5 Units total) into the skin daily with lunch.    Dispense:  10 mL    Refill:  0    Supervising Provider:   Bambi Lever A [[id number]]

## 2023-10-26 NOTE — Patient Instructions (Addendum)
 Please go downstairs for labs before you leave.   Continue Lantus  100 units daily   Continue glimepiride    Start Humalog  5 units with lunch or supper. This is a short acting insulin  with your largest meal of the day.   Please see the diabetes specialist as scheduled in May.   I recommend reaching out to your vascular specialist, Dr. Shaunna Delaware, to see if you are supposed to follow up or be on any blood thinning medication. They may want to see you again.

## 2023-10-27 LAB — HEPATITIS C ANTIBODY: Hepatitis C Ab: NONREACTIVE

## 2023-10-28 ENCOUNTER — Encounter: Payer: Self-pay | Admitting: Pharmacist

## 2023-10-28 NOTE — Telephone Encounter (Unsigned)
 Copied from CRM [id number]. Topic: Clinical - Medication Question >> Oct 28, 2023  8:09 AM Earl Harvey wrote: Reason for CRM: Patient states the medication he discussed with the pharmacist is not working. Sugar is 160 and above. 200 all day yesterday. Callback number is  [phone]

## 2023-10-28 NOTE — Telephone Encounter (Signed)
 Called pt and confirmed he was doing the 5 units prior to his meal and was not working so he self increased his dose to 20 units yesterday and reports that is still not working, still having high readings 160s. Advised pt of instructions for Humalog  given by pharmacist, pt states "its not going to work" to which I reassured pt he has an appt w her coming up on Monday and can further discuss this.

## 2023-11-01 ENCOUNTER — Other Ambulatory Visit (INDEPENDENT_AMBULATORY_CARE_PROVIDER_SITE_OTHER): Admitting: Pharmacist

## 2023-11-01 DIAGNOSIS — E1165 Type 2 diabetes mellitus with hyperglycemia: Secondary | ICD-10-CM

## 2023-11-01 MED ORDER — INSULIN LISPRO 100 UNIT/ML IJ SOLN: 10.0000 [IU] | Freq: Two times a day (BID) | INTRAMUSCULAR | Status: DC

## 2023-11-01 NOTE — Patient Instructions (Signed)
 It was a pleasure speaking with you today!  Increase Humalog  to 10 units twice daily before breakfast and dinner. Continue monitoring blood sugars, at least twice daily, before insulin  injections.   Feel free to call with any questions or concerns!  Rainelle Bur, PharmD, BCPS, CPP Clinical Pharmacist Practitioner [facility] [phone]

## 2023-11-01 NOTE — Progress Notes (Cosign Needed)
 11/01/2023 Name: Earl Harvey MRN: [id number] DOB: 1956-02-13  Chief Complaint  Patient presents with   Diabetes   Medication Management    Earl Harvey is a 68 y.o. year old male who presented for a telephone visit.   They were referred to the pharmacist by their PCP for assistance in managing diabetes.    Subjective:  Care Team: Primary Care Provider: Abram Abraham, NP-C ; Next Scheduled Visit: 10/26/23   Medication Access/Adherence  Current Pharmacy:  [facility] [address] ([address]), [place] - [address] [place] ([place]) [place] [address] Phone: [phone] Fax: [phone]   Patient reports affordability concerns with their medications: No  Patient reports access/transportation concerns to their pharmacy: No  Patient reports adherence concerns with their medications:  No    Diabetes:  Current medications: Lantus  100 units daily (recommended 50 units BID, however pt switched back to 100 units daily), Jardiance  25 mg daily, glimepiride  2 mg BID, Humalog  8 units before largest meal (started 10/26/23) *He is also taking berberine and cinnamon supplements for diabetes Medications tried in the past: Trulicity (didn't work well per pt), metformin , lantus  (to reduce volume and for better duration of action - was switched to tresiba )  Last appt recommended ozempic , however pt refused due to side effects/what he read online  Current glucose readings: Pt started Dexcom G7 Sample on 4/22 4/23 205 fasting, staying above 200, 290 at bedtime 4/24 163 fasting, stayed 200, up to 378, down to 293 at bedtime 4/25170 fasting, stayed 200, 254 after eating, 230 betime 4/26 124 fasting, under 200 whole day, 248 supper, 191 bedtime 4/27 - CGM started showing incorrect readings. Was showing lows down to 40-50s, pt would check fingerstick and BG was 200 Monday 266 now after breakfast by fingerstick  Dexcom stats: 3 day 3% very high, 24% high, 63% in range, 5%  low, 5% very low Average BG 141  Using glucometer; testing 2 times daily *Pt notes he is not interested in CGM device for long term use at this time  - Typically eating breakfast, and then 5 PM dinner. Sometimes snack in between  Current medication access support: has Medicaid   Objective:  Lab Results  Component Value Date   HGBA1C 9.2 (H) 10/26/2023    Lab Results  Component Value Date   CREATININE 1.21 10/26/2023   BUN 29 (H) 10/26/2023   NA 137 10/26/2023   K 3.9 10/26/2023   CL 101 10/26/2023   CO2 26 10/26/2023    Lab Results  Component Value Date   CHOL 204 (H) 10/26/2023   HDL 31.10 (L) 10/26/2023   LDLCALC 91 01/04/2023   LDLDIRECT 121.0 10/26/2023   TRIG (H) 10/26/2023    437.0 Triglyceride is over 400; calculations on Lipids are invalid.   CHOLHDL 7 10/26/2023    Medications Reviewed Today     Reviewed by Dion Frankel, [facility] (Pharmacist) on 08/24/23 at 1139  Med List Status: <None>   Medication Order Taking? Sig Documenting Provider Last Dose Status Informant  allopurinol  (ZYLOPRIM ) 300 MG tablet [id number]  Take 1 tablet (300 mg total) by mouth every other day. Henson, Vickie L, NP-C  Active   aspirin EC 81 MG tablet [id number]  Take 81 mg by mouth daily. Swallow whole. [provider]  Active   Blood Glucose Monitoring Suppl DEVI [id number] Yes Use to check blood sugars. May substitute to any manufacturer covered by patient's insurance. Henson, Vickie L, NP-C Taking  Active   chlorthalidone  (HYGROTON ) 25 MG tablet [id number]  Take 1 tablet (25 mg total) by mouth daily. Albertha Huger, FNP  Active   Cholecalciferol (VITAMIN D3) 125 MCG (5000 UT) TABS [id number]  Take 1 tablet by mouth daily. [provider]  Active   empagliflozin  (JARDIANCE ) 25 MG TABS tablet [id number] Yes Take 1 tablet (25 mg total) by mouth daily before breakfast. Albertha Huger, FNP Taking Active   ezetimibe  (ZETIA ) 10 MG tablet [id number]  Take 1 tablet (10 mg  total) by mouth daily. Henson, Vickie L, NP-C  Active   fish oil-omega-3 fatty acids 1000 MG capsule [id number]  Take 2 g by mouth daily.   [provider]  Active Self  Garlic 500 MG TABS [id number]  Take 1 tablet by mouth daily.   [provider]  Active Self  glimepiride  (AMARYL ) 2 MG tablet [id number] Yes Take 1 tablet (2 mg total) by mouth 2 (two) times daily with a meal. Albertha Huger, FNP Taking Active   Glucose Blood (BLOOD GLUCOSE TEST STRIPS) STRP [id number]  Use to check blood sugars 2 times daily. May substitute to any manufacturer covered by patient's insurance. Henson, Vickie L, NP-C  Active   hydrOXYzine  (ATARAX ) 25 MG tablet [id number]  Take 1 tablet (25 mg total) by mouth every 6 (six) hours as needed for itching. Scarlette Currier, MD  Active   ibuprofen  (ADVIL ,MOTRIN ) 800 MG tablet [id number]  Take 1 tablet (800 mg total) by mouth 3 (three) times daily. Mandy Second, PA-C  Active   insulin  glargine (LANTUS ) 100 UNIT/ML Solostar Pen [id number] Yes Inject 1 ml  under the skin  Patient taking differently: Inject 100 Units into the skin daily. Inject 1 ml  under the skin   Anton Baton, NP Taking Active   Lancet Device MISC [id number]  Use to check blood sugars. May substitute to any manufacturer covered by patient's insurance. Henson, Vickie L, NP-C  Active   Lancets Misc. MISC [id number]  Use to check blood sugars 2 times daily. May substitute to any manufacturer covered by patient's insurance. Henson, Vickie L, NP-C  Active   levocetirizine (XYZAL ) 5 MG tablet [id number]  Take 1 tablet (5 mg total) by mouth every evening.  Patient not taking: Reported on 07/28/2023   Albertha Huger, FNP  Active   levothyroxine  (SYNTHROID ) 125 MCG tablet [id number]  Take 1 tablet (125 mcg total) by mouth every morning. Albertha Huger, FNP  Active   losartan  (COZAAR ) 50 MG tablet [id number]  Take 50 mg by mouth daily. [provider]  Active   Multiple  Vitamins-Minerals (MULTIVITAMINS THER. W/MINERALS) TABS [id number]  Take 1 tablet by mouth daily.   [provider]  Active Self  triamcinolone  ointment (KENALOG ) 0.5 % [id number]  Apply 1 Application topically 2 (two) times daily. Albertha Huger, FNP  Active   Turmeric (QC TUMERIC COMPLEX PO) [id number]  Take by mouth daily. [provider]  Active               Assessment/Plan:   Diabetes: - Currently uncontrolled, BG goal <7.5% - Reviewed long term cardiovascular and renal outcomes of uncontrolled blood sugar - Reviewed goal A1c, goal fasting, and goal 2 hour post prandial glucose - Recommend increasing Humalog  to 10 units prior to breakfast and dinner. Continue Lantus  and jardiance , glimepiride  - Recommend to check glucose BID - Pt unwilling to take GLP-1 agonist. States he does not take any medications with BBW  Follow Up Plan: 5/6   Rainelle Bur, PharmD, BCPS, CPP Clinical Pharmacist Practitioner [facility] [phone]  Medical screening examination/treatment/procedure(s) were performed by non-physician practitioner and as supervising physician I was immediately available for consultation/collaboration.  I agree with above. Adelaide Holy, MD

## 2023-11-05 ENCOUNTER — Encounter: Payer: Self-pay | Admitting: Pediatrics

## 2023-11-09 ENCOUNTER — Other Ambulatory Visit (INDEPENDENT_AMBULATORY_CARE_PROVIDER_SITE_OTHER): Admitting: Pharmacist

## 2023-11-09 DIAGNOSIS — E1165 Type 2 diabetes mellitus with hyperglycemia: Secondary | ICD-10-CM

## 2023-11-09 MED ORDER — INSULIN LISPRO 100 UNIT/ML IJ SOLN: 10.0000 [IU] | Freq: Two times a day (BID) | INTRAMUSCULAR | 0 refills | Status: DC

## 2023-11-09 MED ORDER — CANAGLIFLOZIN 100 MG PO TABS: 100.0000 mg | ORAL_TABLET | Freq: Every day | ORAL | 0 refills | Status: DC

## 2023-11-09 MED ORDER — LANTUS SOLOSTAR 100 UNIT/ML ~~LOC~~ SOPN
100.0000 [IU] | PEN_INJECTOR | Freq: Every day | SUBCUTANEOUS | 2 refills | Status: DC
Start: 2023-11-09 — End: 2023-12-01

## 2023-11-09 NOTE — Patient Instructions (Signed)
 It was a pleasure speaking with you today!  Start Invokana 100 mg daily. Continue Lantus  100 units daily and Humalog  10 units twice daily before each meal.  Keep checking blood sugars at least twice daily and keep a log to take to the endocrinologist.  Feel free to call with any questions or concerns!  Rainelle Bur, PharmD, BCPS, CPP Clinical Pharmacist Practitioner  [facility] at [facility] [phone]

## 2023-11-09 NOTE — Progress Notes (Signed)
 11/09/2023 Name: Earl Harvey MRN: [id number] DOB: 06/25/1956  Chief Complaint  Patient presents with   Diabetes   Medication Management    Earl Harvey is a 68 y.o. year old male who presented for a telephone visit.   They were referred to the pharmacist by their PCP for assistance in managing diabetes.    Subjective:  Care Team: Primary Care Provider: Abram Abraham, NP-C ; Next Scheduled Visit: 10/26/23   Medication Access/Adherence  Current Pharmacy:  [facility] [address] ([address]), [place] - [address] [place] ([place]) [place] [address] Phone: [phone] Fax: [phone]   Patient reports affordability concerns with their medications: No  Patient reports access/transportation concerns to their pharmacy: No  Patient reports adherence concerns with their medications:  No    Diabetes:  Current medications: Lantus  100 units daily (recommended 50 units BID, however pt switched back to 100 units daily), glimepiride  2 mg BID, Humalog  10 units before largest meal (started 10/26/23) (pt sometimes taking 15-20 units) *He is also taking berberine and cinnamon supplements for diabetes Medications tried in the past: Trulicity (didn't work well per pt), metformin , lantus  (to reduce volume and for better duration of action - was switched to tresiba ), Jardiance  (patient reported yeast infection)  *Pt requests trying Invokana  Last appt recommended ozempic , however pt refused due to side effects/what he read online  Current glucose readings: Reports fasting still 140-150, then goes up after eating.   Using glucometer; testing 2 times daily *Pt notes he is not interested in CGM device for long term use at this time  - Typically eating breakfast, and then 5 PM dinner. Sometimes snack in between  Current medication access support: has Medicaid   Objective:  Lab Results  Component Value Date   HGBA1C 9.2 (H) 10/26/2023    Lab Results   Component Value Date   CREATININE 1.21 10/26/2023   BUN 29 (H) 10/26/2023   NA 137 10/26/2023   K 3.9 10/26/2023   CL 101 10/26/2023   CO2 26 10/26/2023    Lab Results  Component Value Date   CHOL 204 (H) 10/26/2023   HDL 31.10 (L) 10/26/2023   LDLCALC 91 01/04/2023   LDLDIRECT 121.0 10/26/2023   TRIG (H) 10/26/2023    437.0 Triglyceride is over 400; calculations on Lipids are invalid.   CHOLHDL 7 10/26/2023    Medications Reviewed Today     Reviewed by Dion Frankel, [facility] (Pharmacist) on 08/24/23 at 1139  Med List Status: <None>   Medication Order Taking? Sig Documenting Provider Last Dose Status Informant  allopurinol  (ZYLOPRIM ) 300 MG tablet [id number]  Take 1 tablet (300 mg total) by mouth every other day. Henson, Vickie L, NP-C  Active   aspirin EC 81 MG tablet [id number]  Take 81 mg by mouth daily. Swallow whole. [provider]  Active   Blood Glucose Monitoring Suppl DEVI [id number] Yes Use to check blood sugars. May substitute to any manufacturer covered by patient's insurance. Henson, Vickie L, NP-C Taking Active   chlorthalidone  (HYGROTON ) 25 MG tablet [id number]  Take 1 tablet (25 mg total) by mouth daily. Albertha Huger, FNP  Active   Cholecalciferol (VITAMIN D3) 125 MCG (5000 UT) TABS [id number]  Take 1 tablet by mouth daily. [provider]  Active   empagliflozin  (JARDIANCE ) 25 MG TABS tablet [id number] Yes Take 1 tablet (25 mg total) by mouth daily before breakfast. Albertha Huger, FNP Taking Active  ezetimibe  (ZETIA ) 10 MG tablet [id number]  Take 1 tablet (10 mg total) by mouth daily. Henson, Vickie L, NP-C  Active   fish oil-omega-3 fatty acids 1000 MG capsule [id number]  Take 2 g by mouth daily.   [provider]  Active Self  Garlic 500 MG TABS [id number]  Take 1 tablet by mouth daily.   [provider]  Active Self  glimepiride  (AMARYL ) 2 MG tablet [id number] Yes Take 1 tablet (2 mg total) by mouth 2 (two) times daily with  a meal. Albertha Huger, FNP Taking Active   Glucose Blood (BLOOD GLUCOSE TEST STRIPS) STRP [id number]  Use to check blood sugars 2 times daily. May substitute to any manufacturer covered by patient's insurance. Henson, Vickie L, NP-C  Active   hydrOXYzine  (ATARAX ) 25 MG tablet [id number]  Take 1 tablet (25 mg total) by mouth every 6 (six) hours as needed for itching. Scarlette Currier, MD  Active   ibuprofen  (ADVIL ,MOTRIN ) 800 MG tablet [id number]  Take 1 tablet (800 mg total) by mouth 3 (three) times daily. Mandy Second, PA-C  Active   insulin  glargine (LANTUS ) 100 UNIT/ML Solostar Pen [id number] Yes Inject 1 ml  under the skin  Patient taking differently: Inject 100 Units into the skin daily. Inject 1 ml  under the skin   Anton Baton, NP Taking Active   Lancet Device MISC [id number]  Use to check blood sugars. May substitute to any manufacturer covered by patient's insurance. Henson, Vickie L, NP-C  Active   Lancets Misc. MISC [id number]  Use to check blood sugars 2 times daily. May substitute to any manufacturer covered by patient's insurance. Henson, Vickie L, NP-C  Active   levocetirizine (XYZAL ) 5 MG tablet [id number]  Take 1 tablet (5 mg total) by mouth every evening.  Patient not taking: Reported on 07/28/2023   Albertha Huger, FNP  Active   levothyroxine  (SYNTHROID ) 125 MCG tablet [id number]  Take 1 tablet (125 mcg total) by mouth every morning. Albertha Huger, FNP  Active   losartan  (COZAAR ) 50 MG tablet [id number]  Take 50 mg by mouth daily. [provider]  Active   Multiple Vitamins-Minerals (MULTIVITAMINS THER. W/MINERALS) TABS [id number]  Take 1 tablet by mouth daily.   [provider]  Active Self  triamcinolone  ointment (KENALOG ) 0.5 % [id number]  Apply 1 Application topically 2 (two) times daily. Albertha Huger, FNP  Active   Turmeric (QC TUMERIC COMPLEX PO) [id number]  Take by mouth daily. [provider]  Active                Assessment/Plan:   Diabetes: - Currently uncontrolled, BG goal <7.5% - Reviewed long term cardiovascular and renal outcomes of uncontrolled blood sugar - Reviewed goal A1c, goal fasting, and goal 2 hour post prandial glucose - Recommend continuing Humalog  10-15 units prior to each meal, Lantus  100 units daily, and glimepiride  - Okay to try Invokana, although I did explain Invokana has the same side effect risk as Jardiance  in regards to yeast infection and UTI - Recommend to check glucose BID - Pt unwilling to take GLP-1 agonist. States he does not take any medications with BBW - explained to pt that GLP-1 agonist will be the best option to help with insulin  resistance and explained the details of the black box warning (risk of thyroid  cancer seen in mice in studies, none seen in humans, however use is contraindicated for people with personal or family hx of specific type of  thyroid  cancer.) - Has upcoming endo consult next week   Follow Up Plan: f/u after endo appt   Rainelle Bur, PharmD, BCPS, CPP Clinical Pharmacist Practitioner [facility] [phone]

## 2023-11-11 ENCOUNTER — Telehealth: Payer: Self-pay | Admitting: Family Medicine

## 2023-11-11 ENCOUNTER — Other Ambulatory Visit (HOSPITAL_COMMUNITY): Payer: Self-pay

## 2023-11-11 NOTE — Telephone Encounter (Signed)
 Copied from CRM [id number]. Topic: Clinical - Prescription Issue >> Nov 10, 2023  4:52 PM Magdalene School wrote: Reason for CRM: Patient is calling regarding canagliflozin (INVOKANA) 100 MG TABS tablet. He stated that his pharmacy informed him that this medication is not covered by insurance.Patient stated that he needs to start this medication quickly because he is having a hard time with his blood sugar levels.

## 2023-11-12 ENCOUNTER — Telehealth: Payer: Self-pay

## 2023-11-12 ENCOUNTER — Other Ambulatory Visit (HOSPITAL_COMMUNITY): Payer: Self-pay

## 2023-11-12 NOTE — Telephone Encounter (Signed)
 Pharmacy Patient Advocate Encounter   Received notification from Pt Calls Messages that prior authorization for Invokana  100mg  tabs is required/requested.   Insurance verification completed.   The patient is insured through [facility] .   Per test claim: PA required; PA submitted to above mentioned insurance via CoverMyMeds Key/confirmation #/EOC [id number] Status is pending

## 2023-11-15 NOTE — Telephone Encounter (Signed)
 Patient is needing a call back regarding his medication

## 2023-11-15 NOTE — Telephone Encounter (Signed)
 Pharmacy Patient Advocate Encounter  Received notification from [employer] that Prior Authorization for Invokana  100mg  tabs has been DENIED.  See denial reason below. No denial letter attached in CMM. Will attach denial letter to Media tab once received.   PA #/Case ID/Reference #:  [id number]

## 2023-11-17 ENCOUNTER — Encounter (INDEPENDENT_AMBULATORY_CARE_PROVIDER_SITE_OTHER): Payer: 59 | Admitting: Ophthalmology

## 2023-11-17 DIAGNOSIS — E113391 Type 2 diabetes mellitus with moderate nonproliferative diabetic retinopathy without macular edema, right eye: Secondary | ICD-10-CM | POA: Diagnosis not present

## 2023-11-17 DIAGNOSIS — Z794 Long term (current) use of insulin: Secondary | ICD-10-CM

## 2023-11-17 DIAGNOSIS — Z7984 Long term (current) use of oral hypoglycemic drugs: Secondary | ICD-10-CM

## 2023-11-17 DIAGNOSIS — H4310 Vitreous hemorrhage, unspecified eye: Secondary | ICD-10-CM | POA: Diagnosis not present

## 2023-11-17 DIAGNOSIS — H43813 Vitreous degeneration, bilateral: Secondary | ICD-10-CM

## 2023-11-17 DIAGNOSIS — E113592 Type 2 diabetes mellitus with proliferative diabetic retinopathy without macular edema, left eye: Secondary | ICD-10-CM

## 2023-11-18 ENCOUNTER — Encounter: Payer: Self-pay | Admitting: "Endocrinology

## 2023-11-18 ENCOUNTER — Ambulatory Visit (INDEPENDENT_AMBULATORY_CARE_PROVIDER_SITE_OTHER): Admitting: "Endocrinology

## 2023-11-18 VITALS — BP 130/70 | HR 66 | Ht 70.0 in | Wt 224.0 lb

## 2023-11-18 DIAGNOSIS — Z794 Long term (current) use of insulin: Secondary | ICD-10-CM | POA: Diagnosis not present

## 2023-11-18 DIAGNOSIS — E782 Mixed hyperlipidemia: Secondary | ICD-10-CM

## 2023-11-18 DIAGNOSIS — Z7984 Long term (current) use of oral hypoglycemic drugs: Secondary | ICD-10-CM | POA: Diagnosis not present

## 2023-11-18 DIAGNOSIS — E1165 Type 2 diabetes mellitus with hyperglycemia: Secondary | ICD-10-CM | POA: Diagnosis not present

## 2023-11-18 DIAGNOSIS — E1169 Type 2 diabetes mellitus with other specified complication: Secondary | ICD-10-CM

## 2023-11-18 LAB — POCT GLYCOSYLATED HEMOGLOBIN (HGB A1C): Hemoglobin A1C: 8.8 % — AB (ref 4.0–5.6)

## 2023-11-18 MED ORDER — DEXCOM G7 SENSOR MISC: 1.0000 | 3 refills | Status: AC

## 2023-11-18 MED ORDER — BAQSIMI ONE PACK 3 MG/DOSE NA POWD: 1.0000 | NASAL | 3 refills | Status: AC | PRN

## 2023-11-18 MED ORDER — DAPAGLIFLOZIN PROPANEDIOL 5 MG PO TABS: 5.0000 mg | ORAL_TABLET | Freq: Every day | ORAL | 3 refills | Status: DC

## 2023-11-18 NOTE — Progress Notes (Signed)
 Outpatient Endocrinology Note Earl Newcomer, MD  11/18/23   Earl Harvey Jan 13, 1965 [id number]  Referring Provider: Abram Abraham, NP-C Primary Care Provider: Abram Abraham, NP-C Reason for consultation: Subjective   Assessment & Plan  Diagnoses and all orders for this visit:  Uncontrolled type 2 diabetes mellitus with hyperglycemia (HCC) -     POCT glycosylated hemoglobin (Hb A1C) -     dapagliflozin propanediol (FARXIGA) 5 MG TABS tablet; Take 1 tablet (5 mg total) by mouth daily before breakfast. -     Continuous Glucose Sensor (DEXCOM G7 SENSOR) MISC; 1 Device by Does not apply route continuous. -     Glucagon (BAQSIMI ONE PACK) 3 MG/DOSE POWD; Place 1 Device into the nose as needed (Low blood sugar with impaired consciousness).  Type 2 diabetes mellitus with other specified complication, with long-term current use of insulin  (HCC)  Long term (current) use of oral hypoglycemic drugs  Long-term insulin  use (HCC)  Mixed hypercholesterolemia and hypertriglyceridemia    Diabetes Type II complicated by neuropathy,  Lab Results  Component Value Date   GFR 62.41 10/26/2023   Hba1c goal less than 7, current Hba1c is  Lab Results  Component Value Date   HGBA1C 8.8 (A) 11/18/2023   Will recommend the following: Lantus  100 units every day Glimepiride  2 mg bid Humalog  22 units before break fast, start 6 units before mid-day snack and 24 units for dinner - 15 min before meals   No known contraindications/side effects to any of above medications Glucagon discussed and prescribed with refills on 11/18/23     -Last LD and Tg are as follows: Lab Results  Component Value Date   LDLCALC 91 01/04/2023    Lab Results  Component Value Date   TRIG (H) 10/26/2023    437.0 Triglyceride is over 400; calculations on Lipids are invalid.   -not on statin (11/18/23: reports problems in the past and refuses to take it), on ezetimibe  10 mg QD -Follow low fat diet and  exercise   -Blood pressure goal <140/90 - Microalbumin/creatinine goal is < 30 -Last MA/Cr is as follows: Lab Results  Component Value Date   MICROALBUR 3.6 (H) 07/28/2023   -on ACE/ARB losartan  50 mg every day  -diet changes including salt restriction -limit eating outside -counseled BP targets per standards of diabetes care -uncontrolled blood pressure can lead to retinopathy, nephropathy and cardiovascular and atherosclerotic heart disease  Reviewed and counseled on: -A1C target -Blood sugar targets -Complications of uncontrolled diabetes  -Checking blood sugar before meals and bedtime and bring log next visit -All medications with mechanism of action and side effects -Hypoglycemia management: rule of 15's, Glucagon Emergency Kit and medical alert ID -low-carb low-fat plate-method diet -At least 20 minutes of physical activity per day -Annual dilated retinal eye exam and foot exam -compliance and follow up needs -follow up as scheduled or earlier if problem gets worse  Call if blood sugar is less than 70 or consistently above 250    Take a 15 gm snack of carbohydrate at bedtime before you go to sleep if your blood sugar is less than 100.    If you are going to fast after midnight for a test or procedure, ask your physician for instructions on how to reduce/decrease your insulin  dose.    Call if blood sugar is less than 70 or consistently above 250  -Treating a low sugar by rule of 15  (15 gms of sugar every 15 min until  sugar is more than 70) If you feel your sugar is low, test your sugar to be sure If your sugar is low (less than 70), then take 15 grams of a fast acting Carbohydrate (3-4 glucose tablets or glucose gel or 4 ounces of juice or regular soda) Recheck your sugar 15 min after treating low to make sure it is more than 70 If sugar is still less than 70, treat again with 15 grams of carbohydrate          Don't drive the hour of hypoglycemia  If unconscious/unable  to eat or drink by mouth, use glucagon injection or nasal spray baqsimi and call 911. Can repeat again in 15 min if still unconscious.  Return in about 6 weeks (around 12/30/2023).   I have reviewed current medications, nurse's notes, allergies, vital signs, past medical and surgical history, family medical history, and social history for this encounter. Counseled patient on symptoms, examination findings, lab findings, imaging results, treatment decisions and monitoring and prognosis. The patient understood the recommendations and agrees with the treatment plan. All questions regarding treatment plan were fully answered.  Earl Newcomer, MD  11/18/23    History of Present Illness Earl Harvey is a 68 y.o. year old male who presents for evaluation of Type II diabetes mellitus.  Earl Harvey Earl Harvey was first diagnosed before 1995.   Diabetes education +  Home diabetes regimen: Glimepiride  2 mg bid Lantus  100 units every day Humalog  20 units before break fast and dinner - 15 min before meals   COMPLICATIONS -  MI/Stroke -  retinopathy +  neuropathy -  nephropathy  BLOOD SUGAR DATA  CGM interpretation: At today's visit, we reviewed her CGM downloads. The full report is scanned in the media. Reviewing the CGM trends, BG are elevated after all meals, and low overnight sometimes.    Physical Exam  BP 130/70   Pulse 66   Ht 5\' 10"  (1.778 m)   Wt 224 lb (101.6 kg)   SpO2 97%   BMI 32.14 kg/m    Constitutional: well developed, well nourished Head: normocephalic, atraumatic Eyes: sclera anicteric, no redness Neck: supple Lungs: normal respiratory effort Neurology: alert and oriented Skin: dry, no appreciable rashes Musculoskeletal: no appreciable defects Psychiatric: normal mood and affect Diabetic Foot Exam - Simple   No data filed      Current Medications Patient's Medications  New Prescriptions   CONTINUOUS GLUCOSE SENSOR (DEXCOM G7 SENSOR) MISC    1 Device by Does not  apply route continuous.   DAPAGLIFLOZIN PROPANEDIOL (FARXIGA) 5 MG TABS TABLET    Take 1 tablet (5 mg total) by mouth daily before breakfast.   GLUCAGON (BAQSIMI ONE PACK) 3 MG/DOSE POWD    Place 1 Device into the nose as needed (Low blood sugar with impaired consciousness).  Previous Medications   ALLOPURINOL  (ZYLOPRIM ) 300 MG TABLET    Take 1 tablet (300 mg total) by mouth every other day.   ASPIRIN EC 81 MG TABLET    Take 81 mg by mouth daily. Swallow whole.   BLOOD GLUCOSE MONITORING SUPPL DEVI    Use to check blood sugars. May substitute to any manufacturer covered by patient's insurance.   CHLORTHALIDONE  (HYGROTON ) 25 MG TABLET    Take 1 tablet (25 mg total) by mouth daily.   CHOLECALCIFEROL (VITAMIN D3) 125 MCG (5000 UT) TABS    Take 1 tablet by mouth daily.   EZETIMIBE  (ZETIA ) 10 MG TABLET    Take 1 tablet  by mouth once daily   FISH OIL-OMEGA-3 FATTY ACIDS 1000 MG CAPSULE    Take 2 g by mouth daily.     GARLIC 500 MG TABS    Take 1 tablet by mouth daily.     GLIMEPIRIDE  (AMARYL ) 2 MG TABLET    Take 1 tablet (2 mg total) by mouth 2 (two) times daily with a meal.   GLUCOSE BLOOD (BLOOD GLUCOSE TEST STRIPS) STRP    Use to check blood sugars 2 times daily. May substitute to any manufacturer covered by patient's insurance.   HYDROXYZINE  (ATARAX ) 25 MG TABLET    Take 1 tablet (25 mg total) by mouth every 6 (six) hours as needed for itching.   IBUPROFEN  (ADVIL ,MOTRIN ) 800 MG TABLET    Take 1 tablet (800 mg total) by mouth 3 (three) times daily.   INSULIN  GLARGINE (LANTUS  SOLOSTAR) 100 UNIT/ML SOLOSTAR PEN    Inject 100 Units into the skin daily.   INSULIN  LISPRO (HUMALOG ) 100 UNIT/ML INJECTION    Inject 0.1 mLs (10 Units total) into the skin 2 (two) times daily. Take 10-15 minutes prior to meals.   LANCET DEVICE MISC    Use to check blood sugars. May substitute to any manufacturer covered by patient's insurance.   LANCETS MISC. MISC    Use to check blood sugars 2 times daily. May substitute to  any manufacturer covered by patient's insurance.   LEVOCETIRIZINE (XYZAL ) 5 MG TABLET    Take 1 tablet (5 mg total) by mouth every evening.   LEVOTHYROXINE  (SYNTHROID ) 125 MCG TABLET    Take 1 tablet (125 mcg total) by mouth every morning.   LOSARTAN  (COZAAR ) 50 MG TABLET    Take 1 tablet (50 mg total) by mouth daily.   MULTIPLE VITAMINS-MINERALS (MULTIVITAMINS THER. W/MINERALS) TABS    Take 1 tablet by mouth daily.     TRIAMCINOLONE  OINTMENT (KENALOG ) 0.5 %    Apply 1 Application topically 2 (two) times daily.   TURMERIC (QC TUMERIC COMPLEX PO)    Take by mouth daily.  Modified Medications   No medications on file  Discontinued Medications   CANAGLIFLOZIN  (INVOKANA ) 100 MG TABS TABLET    Take 1 tablet (100 mg total) by mouth daily before breakfast.    Allergies No Known Allergies  Past Medical History Past Medical History:  Diagnosis Date   Cataract Dec.2024   Diabetes mellitus    Diverticulitis    Gout    Hypercholesteremia    Hypertension    Kidney disease    Left foot pain 05/29/2022   Rash 07/30/2022   Thyroid  disease     Past Surgical History Past Surgical History:  Procedure Laterality Date   CERVICAL DISCECTOMY     EYE SURGERY     JOINT REPLACEMENT  1995    Family History family history includes Alcohol abuse in his brother and father; Anxiety disorder in his brother; Arthritis in his brother and father; COPD in his mother; Cancer in his brother and brother; Depression in his brother; Epilepsy in his daughter; Heart disease in his father; Heart failure in his father; Hyperlipidemia in his brother, father, mother, and sister; Hypertension in his brother, father, and sister; Stroke in his father.  Social History Social History   Socioeconomic History   Marital status: Divorced    Spouse name: Not on file   Number of children: 1   Years of education: 13   Highest education level: GED or equivalent  Occupational History   Not on  file  Tobacco Use   Smoking  status: Former    Current packs/day: 0.00    Types: Cigarettes    Quit date: 05/07/1991    Years since quitting: 32.5   Smokeless tobacco: Never  Vaping Use   Vaping status: Never Used  Substance and Sexual Activity   Alcohol use: Not Currently    Comment: Occasionally   Drug use: No   Sexual activity: Not Currently  Other Topics Concern   Not on file  Social History Narrative   Not on file   Social Drivers of Health   Financial Resource Strain: Medium Risk (07/27/2023)   Overall Financial Resource Strain (CARDIA)    Difficulty of Paying Living Expenses: Somewhat hard  Food Insecurity: Food Insecurity Present (07/27/2023)   Hunger Vital Sign    Worried About Running Out of Food in the Last Year: Sometimes true    Ran Out of Food in the Last Year: Never true  Transportation Needs: No Transportation Needs (07/27/2023)   PRAPARE - Administrator, Civil Service (Medical): No    Lack of Transportation (Non-Medical): No  Physical Activity: Insufficiently Active (07/27/2023)   Exercise Vital Sign    Days of Exercise per Week: 2 days    Minutes of Exercise per Session: 20 min  Stress: No Stress Concern Present (07/27/2023)   [employer] of Occupational Health - Occupational Stress Questionnaire    Feeling of Stress : Not at all  Social Connections: Socially Isolated (07/27/2023)   Social Connection and Isolation Panel [NHANES]    Frequency of Communication with Friends and Family: More than three times a week    Frequency of Social Gatherings with Friends and Family: More than three times a week    Attends Religious Services: Never    Database administrator or Organizations: No    Attends Banker Meetings: Never    Marital Status: Divorced  Catering manager Violence: Not At Risk (02/11/2023)   Humiliation, Afraid, Rape, and Kick questionnaire    Fear of Current or Ex-Partner: No    Emotionally Abused: No    Physically Abused: No    Sexually Abused: No     Lab Results  Component Value Date   HGBA1C 8.8 (A) 11/18/2023   HGBA1C 9.2 (H) 10/26/2023   HGBA1C 9.0 (H) 07/28/2023   Lab Results  Component Value Date   CHOL 204 (H) 10/26/2023   Lab Results  Component Value Date   HDL 31.10 (L) 10/26/2023   Lab Results  Component Value Date   LDLCALC 91 01/04/2023   Lab Results  Component Value Date   TRIG (H) 10/26/2023    437.0 Triglyceride is over 400; calculations on Lipids are invalid.   Lab Results  Component Value Date   CHOLHDL 7 10/26/2023   Lab Results  Component Value Date   CREATININE 1.21 10/26/2023   Lab Results  Component Value Date   GFR 62.41 10/26/2023   Lab Results  Component Value Date   MICROALBUR 3.6 (H) 07/28/2023      Component Value Date/Time   NA 137 10/26/2023 0903   NA 142 04/08/2023 0830   K 3.9 10/26/2023 0903   CL 101 10/26/2023 0903   CO2 26 10/26/2023 0903   GLUCOSE 170 (H) 10/26/2023 0903   BUN 29 (H) 10/26/2023 0903   BUN 28 (H) 04/08/2023 0830   CREATININE 1.21 10/26/2023 0903   CALCIUM 10.0 10/26/2023 0903   PROT 7.9 10/26/2023 0903  PROT 6.9 04/08/2023 0830   ALBUMIN 4.6 10/26/2023 0903   ALBUMIN 4.3 04/08/2023 0830   AST 27 10/26/2023 0903   ALT 39 10/26/2023 0903   ALKPHOS 56 10/26/2023 0903   BILITOT 0.4 10/26/2023 0903   BILITOT <0.2 04/08/2023 0830   GFRNONAA 52 (L) 01/04/2022 1303      Latest Ref Rng & Units 10/26/2023    9:03 AM 07/28/2023    2:02 PM 04/08/2023    8:30 AM  BMP  Glucose 70 - 99 mg/dL 454  098  119   BUN 6 - 23 mg/dL 29  23  28    Creatinine 0.40 - 1.50 mg/dL 1.47  8.29  5.62   BUN/Creat Ratio 10 - 24   23   Sodium 135 - 145 mEq/L 137  137  142   Potassium 3.5 - 5.1 mEq/L 3.9  4.1  4.1   Chloride 96 - 112 mEq/L 101  97  101   CO2 19 - 32 mEq/L 26  31  23    Calcium 8.4 - 10.5 mg/dL 13.0  9.9  9.9        Component Value Date/Time   WBC 8.7 10/26/2023 0903   RBC 5.06 10/26/2023 0903   HGB 14.8 10/26/2023 0903   HGB 13.6 04/08/2023 0830    HCT 44.7 10/26/2023 0903   HCT 40.7 04/08/2023 0830   PLT 277.0 10/26/2023 0903   PLT 247 04/08/2023 0830   MCV 88.4 10/26/2023 0903   MCV 90 04/08/2023 0830   MCH 30.0 04/08/2023 0830   MCH 30.5 01/04/2022 1303   MCHC 33.2 10/26/2023 0903   RDW 13.7 10/26/2023 0903   RDW 13.0 04/08/2023 0830   LYMPHSABS 2.2 10/26/2023 0903   LYMPHSABS 2.2 04/08/2023 0830   MONOABS 0.8 10/26/2023 0903   EOSABS 0.2 10/26/2023 0903   EOSABS 0.2 04/08/2023 0830   BASOSABS 0.0 10/26/2023 0903   BASOSABS 0.0 04/08/2023 0830     Parts of this note may have been dictated using voice recognition software. There may be variances in spelling and vocabulary which are unintentional. Not all errors are proofread. Please notify the [facility] if any discrepancies are noted or if the meaning of any statement is not clear.

## 2023-11-18 NOTE — Patient Instructions (Addendum)
 Will recommend the following:  Lantus  100 units every day Glimepiride  2 mg qam Humalog  22 units before break fast, 6 units before mid-day snack and 24 units for dinner - 15 min before meals  Farxiga 5 mg every day   Goals of DM therapy:  Morning Fasting blood sugar: 80-140  Blood sugar before meals: 80-140 Bed time blood sugar: 100-150  A1C <7%, limited only by hypoglycemia  1.Diabetes medications and their side effects discussed, including hypoglycemia    2. Check blood glucose:  a) Always check blood sugars before driving. Please see below (under hypoglycemia) on how to manage b) Check a minimum of 3 times/day or more as needed when having symptoms of hypoglycemia.   c) Try to check blood glucose before sleeping/in the middle of the night to ensure that it is remaining stable and not dropping less than 100 d) Check blood glucose more often if sick  3. Diet: a) 3 meals per day schedule b: Restrict carbs to 60-70 grams (4 servings) per meal c) Colorful vegetables - 3 servings a day, and low sugar fruit 2 servings/day Plate control method: 1/4 plate protein, 1/4 starch, 1/2 green, yellow, or red vegetables d) Avoid carbohydrate snacks unless hypoglycemic episode, or increased physical activity  4. Regular exercise as tolerated, preferably 3 or more hours a week  5. Hypoglycemia: a)  Do not drive or operate machinery without first testing blood glucose to assure it is over 90 mg%, or if dizzy, lightheaded, not feeling normal, etc, or  if foot or leg is numb or weak. b)  If blood glucose less than 70, take four 5gm Glucose tabs or 15-30 gm Glucose gel.  Repeat every 15 min as needed until blood sugar is >100 mg/dl. If hypoglycemia persists then call 911.   6. Sick day management: a) Check blood glucose more often b) Continue usual therapy if blood sugars are elevated.   7. Contact the doctor immediately if blood glucose is frequently <60 mg/dl, or an episode of severe  hypoglycemia occurs (where someone had to give you glucose/  glucagon or if you passed out from a low blood glucose), or if blood glucose is persistently >350 mg/dl, for further management  8. A change in level of physical activity or exercise and a change in diet may also affect your blood sugar. Check blood sugars more often and call if needed.  Instructions: 1. Bring glucose meter, blood glucose records on every visit for review 2. Continue to follow up with primary care physician and other providers for medical care 3. Yearly eye  and foot exam 4. Please get blood work done prior to the next appointment

## 2023-11-22 ENCOUNTER — Encounter: Payer: Self-pay | Admitting: "Endocrinology

## 2023-11-23 ENCOUNTER — Other Ambulatory Visit: Payer: Self-pay | Admitting: Family Medicine

## 2023-11-23 ENCOUNTER — Other Ambulatory Visit (INDEPENDENT_AMBULATORY_CARE_PROVIDER_SITE_OTHER): Admitting: Pharmacist

## 2023-11-23 DIAGNOSIS — E1165 Type 2 diabetes mellitus with hyperglycemia: Secondary | ICD-10-CM

## 2023-11-23 NOTE — Progress Notes (Signed)
   11/23/2023 Name: Earl Harvey MRN: [id number] DOB: 1955-12-08  Chief Complaint  Patient presents with   Diabetes   Medication Management    Earl Harvey is a 68 y.o. year old male who presented for a telephone visit.   They were referred to the pharmacist by their PCP for assistance in managing diabetes.    Subjective:  Care Team: Primary Care Provider: Abram Abraham, NP-C ; Next Scheduled Visit: 01/25/24   Medication Access/Adherence  Current Pharmacy:  [facility] [address] ([address]), [place] - [address] [place] ([place]) [place] [address] Phone: [phone] Fax: [phone]   Patient reports affordability concerns with their medications: No  Patient reports access/transportation concerns to their pharmacy: No  Patient reports adherence concerns with their medications:  No    Diabetes:  Current medications: Lantus  100 units daily (recommended 50 units BID, however pt switched back to 100 units daily), glimepiride  2 mg BID, Humalog  22 units before breakfast, 6 units before snack, 24 units before dinner, Farxiga  5 mg daily (just started today) *He is also taking berberine and cinnamon supplements for diabetes Medications tried in the past: Trulicity (didn't work well per pt), metformin , lantus  (to reduce volume and for better duration of action - was switched to tresiba ), Jardiance  (patient reported yeast infection)   Last appt recommended ozempic , however pt refused due to side effects/what he read online  Current glucose readings:  Pt notes endo gave him Dexcom sensors but he does not know how to apply the sensor. He notes his BG today was 194  Using glucometer; testing 2 times daily  - Typically eating breakfast (egg sandwich with cheese on wheat bread), and then 5 PM dinner. Sometimes snack in between (usually popcorn)  Current medication access support: has Medicaid   Objective:  Lab Results  Component Value Date   HGBA1C 8.8  (A) 11/18/2023    Lab Results  Component Value Date   CREATININE 1.21 10/26/2023   BUN 29 (H) 10/26/2023   NA 137 10/26/2023   K 3.9 10/26/2023   CL 101 10/26/2023   CO2 26 10/26/2023    Lab Results  Component Value Date   CHOL 204 (H) 10/26/2023   HDL 31.10 (L) 10/26/2023   LDLCALC 91 01/04/2023   LDLDIRECT 121.0 10/26/2023   TRIG (H) 10/26/2023    437.0 Triglyceride is over 400; calculations on Lipids are invalid.   CHOLHDL 7 10/26/2023     Assessment/Plan:   Diabetes: - Currently uncontrolled, BG goal <7.5% - Reviewed long term cardiovascular and renal outcomes of uncontrolled blood sugar - Reviewed goal A1c, goal fasting, and goal 2 hour post prandial glucose - Pt came in office for help applying the Dexcom G7 sensor and get it connected to his monitor. - Pt unwilling to take GLP-1 agonist. States he does not take any medications with BBW - have explained to pt that GLP-1 agonist will be the best option to help with insulin  resistance and explained the details of the black box warning (risk of thyroid  cancer seen in mice in studies, none seen in humans, however use is contraindicated for people with personal or family hx of specific type of thyroid  cancer.) - Reviewed dietary modifications - Endo f/u 12/31/23   Follow Up Plan: PRN   Rainelle Bur, PharmD, BCPS, CPP Clinical Pharmacist Practitioner [facility] [phone]

## 2023-11-23 NOTE — Patient Instructions (Signed)
 It was a pleasure speaking with you today!    Feel free to call with any questions or concerns!  Arbutus Leas, PharmD, BCPS, CPP Clinical Pharmacist Practitioner [facility] [phone]

## 2023-11-30 ENCOUNTER — Telehealth: Payer: Self-pay

## 2023-11-30 NOTE — Telephone Encounter (Signed)
 Called pt regarding blood sugar. Transferred pt to the front desk to be schedule for tomorrow 12/01/23.

## 2023-11-30 NOTE — Telephone Encounter (Signed)
 Pt called office regarding concerning his blood sugar. Pt state that he hasn't been able to get blood sugar under 170. Pt is taken medication as prescribed. Please advise.

## 2023-12-01 ENCOUNTER — Encounter: Payer: Self-pay | Admitting: "Endocrinology

## 2023-12-01 ENCOUNTER — Ambulatory Visit (INDEPENDENT_AMBULATORY_CARE_PROVIDER_SITE_OTHER): Admitting: "Endocrinology

## 2023-12-01 VITALS — BP 130/80 | HR 87 | Ht 70.0 in | Wt 225.0 lb

## 2023-12-01 DIAGNOSIS — Z7984 Long term (current) use of oral hypoglycemic drugs: Secondary | ICD-10-CM | POA: Diagnosis not present

## 2023-12-01 DIAGNOSIS — Z794 Long term (current) use of insulin: Secondary | ICD-10-CM

## 2023-12-01 DIAGNOSIS — E782 Mixed hyperlipidemia: Secondary | ICD-10-CM

## 2023-12-01 DIAGNOSIS — E1165 Type 2 diabetes mellitus with hyperglycemia: Secondary | ICD-10-CM | POA: Diagnosis not present

## 2023-12-01 MED ORDER — HUMULIN R U-500 KWIKPEN 500 UNIT/ML ~~LOC~~ SOPN: PEN_INJECTOR | SUBCUTANEOUS | 3 refills | Status: DC

## 2023-12-01 MED ORDER — INSULIN LISPRO 100 UNIT/ML IJ SOLN
INTRAMUSCULAR | 3 refills | Status: DC
Start: 2023-12-01 — End: 2023-12-01

## 2023-12-01 NOTE — Progress Notes (Signed)
 Outpatient Endocrinology Note Earl Newcomer, MD  12/01/23   Earl Harvey Jan 30, 1956 [id number]  Referring Provider: Abram Abraham, NP-C Primary Care Provider: Abram Abraham, NP-C Reason for consultation: Subjective   Assessment & Plan  Diagnoses and all orders for this visit:  Uncontrolled type 2 diabetes mellitus with hyperglycemia (HCC) -     Discontinue: insulin  lispro (HUMALOG ) 100 UNIT/ML injection; 24 units before break fast and lunch and 28 units before dinner -     Ambulatory referral to diabetic education  Other orders -     insulin  regular human CONCENTRATED (HUMULIN R  U-500 KWIKPEN) 500 UNIT/ML KwikPen; 70 units before break fast, 50 units before lunch and 50 units before supper - 30 min before meals   Diabetes Type II complicated by neuropathy,  Lab Results  Component Value Date   GFR 62.41 10/26/2023   Hba1c goal less than 7, current Hba1c is  Lab Results  Component Value Date   HGBA1C 8.8 (A) 11/18/2023   Will recommend the following: U500 INSULIN : 70 units before break fast, 50 units before lunch and 50 units before supper - 30 min before meals  Glimepiride  2 mg bid Ordered DM education  Patient has been playing with his insulin  doses, to 50 units of Lantus  yesterday instead of 100 units, instructed against it Stop Lantus  100 units every day and stop Humalog  24 units before break fast and lunch and 28 units before dinner - 15 min before meals   No known contraindications/side effects to any of above medications Glucagon  discussed and prescribed with refills on 11/18/23   Not interested in GLP1, calls them "black box/deadly"  -Last LD and Tg are as follows: Lab Results  Component Value Date   LDLCALC 91 01/04/2023    Lab Results  Component Value Date   TRIG (H) 10/26/2023    437.0 Triglyceride is over 400; calculations on Lipids are invalid.   -not on statin (11/18/23: reports problems in the past and refuses to take it), on ezetimibe   10 mg every day and fish oil-omega-3 fatty acids 1000 MG capsule 2 gms daily  -Follow low fat diet and exercise   -Blood pressure goal <140/90 - Microalbumin/creatinine goal is < 30 -Last MA/Cr is as follows: Lab Results  Component Value Date   MICROALBUR 3.6 (H) 07/28/2023   -on ACE/ARB losartan  50 mg every day  -diet changes including salt restriction -limit eating outside -counseled BP targets per standards of diabetes care -uncontrolled blood pressure can lead to retinopathy, nephropathy and cardiovascular and atherosclerotic heart disease  Reviewed and counseled on: -A1C target -Blood sugar targets -Complications of uncontrolled diabetes  -Checking blood sugar before meals and bedtime and bring log next visit -All medications with mechanism of action and side effects -Hypoglycemia management: rule of 15's, Glucagon  Emergency Kit and medical alert ID -low-carb low-fat plate-method diet -At least 20 minutes of physical activity per day -Annual dilated retinal eye exam and foot exam -compliance and follow up needs -follow up as scheduled or earlier if problem gets worse  Call if blood sugar is less than 70 or consistently above 250    Take a 15 gm snack of carbohydrate at bedtime before you go to sleep if your blood sugar is less than 100.    If you are going to fast after midnight for a test or procedure, ask your physician for instructions on how to reduce/decrease your insulin  dose.    Call if blood sugar is less than  70 or consistently above 250  -Treating a low sugar by rule of 15  (15 gms of sugar every 15 min until sugar is more than 70) If you feel your sugar is low, test your sugar to be sure If your sugar is low (less than 70), then take 15 grams of a fast acting Carbohydrate (3-4 glucose tablets or glucose gel or 4 ounces of juice or regular soda) Recheck your sugar 15 min after treating low to make sure it is more than 70 If sugar is still less than 70, treat  again with 15 grams of carbohydrate          Don't drive the hour of hypoglycemia  If unconscious/unable to eat or drink by mouth, use glucagon  injection or nasal spray baqsimi  and call 911. Can repeat again in 15 min if still unconscious.  Return in about 1 week (around 12/08/2023).   I have reviewed current medications, nurse's notes, allergies, vital signs, past medical and surgical history, family medical history, and social history for this encounter. Counseled patient on symptoms, examination findings, lab findings, imaging results, treatment decisions and monitoring and prognosis. The patient understood the recommendations and agrees with the treatment plan. All questions regarding treatment plan were fully answered.  Earl Newcomer, MD  12/01/23    History of Present Illness Earl Harvey is a 68 y.o. year old male who presents for evaluation of Type II diabetes mellitus.  Earl Harvey was first diagnosed before 1995.   Diabetes education +  Home diabetes regimen: Glimepiride  2 mg bid Lantus  100 units every day Humalog  22-24 units tidac - 15 min before meals   COMPLICATIONS -  MI/Stroke -  retinopathy +  neuropathy -  nephropathy  BLOOD SUGAR DATA  CGM interpretation: At today's visit, we reviewed her CGM downloads. The full report is scanned in the media. Reviewing the CGM trends, BG are elevated after all meals, and mainly from 3 PM to 1 AM.  Physical Exam  BP 130/80   Pulse 87   Ht 5\' 10"  (1.778 m)   Wt 225 lb (102.1 kg)   SpO2 98%   BMI 32.28 kg/m    Constitutional: well developed, well nourished Head: normocephalic, atraumatic Eyes: sclera anicteric, no redness Neck: supple Lungs: normal respiratory effort Neurology: alert and oriented Skin: dry, no appreciable rashes Musculoskeletal: no appreciable defects Psychiatric: normal mood and affect Diabetic Foot Exam - Simple   No data filed      Current Medications Patient's Medications  New  Prescriptions   INSULIN  REGULAR HUMAN CONCENTRATED (HUMULIN  R U-500 KWIKPEN) 500 UNIT/ML KWIKPEN    70 units before break fast, 50 units before lunch and 50 units before supper - 30 min before meals  Previous Medications   ALLOPURINOL  (ZYLOPRIM ) 300 MG TABLET    Take 1 tablet (300 mg total) by mouth every other day.   ASPIRIN EC 81 MG TABLET    Take 81 mg by mouth daily. Swallow whole.   BLOOD GLUCOSE MONITORING SUPPL DEVI    Use to check blood sugars. May substitute to any manufacturer covered by patient's insurance.   CHLORTHALIDONE  (HYGROTON ) 25 MG TABLET    Take 1 tablet (25 mg total) by mouth daily.   CHOLECALCIFEROL (VITAMIN D3) 125 MCG (5000 UT) TABS    Take 1 tablet by mouth daily.   CONTINUOUS GLUCOSE SENSOR (DEXCOM G7 SENSOR) MISC    1 Device by Does not apply route continuous.   DAPAGLIFLOZIN  PROPANEDIOL (FARXIGA ) 5  MG TABS TABLET    Take 1 tablet (5 mg total) by mouth daily before breakfast.   EZETIMIBE  (ZETIA ) 10 MG TABLET    Take 1 tablet by mouth once daily   FISH OIL-OMEGA-3 FATTY ACIDS 1000 MG CAPSULE    Take 2 g by mouth daily.     GARLIC 500 MG TABS    Take 1 tablet by mouth daily.     GLIMEPIRIDE  (AMARYL ) 2 MG TABLET    Take 1 tablet (2 mg total) by mouth 2 (two) times daily with a meal.   GLUCAGON  (BAQSIMI  ONE PACK) 3 MG/DOSE POWD    Place 1 Device into the nose as needed (Low blood sugar with impaired consciousness).   GLUCOSE BLOOD (BLOOD GLUCOSE TEST STRIPS) STRP    Use to check blood sugars 2 times daily. May substitute to any manufacturer covered by patient's insurance.   HYDROXYZINE  (ATARAX ) 25 MG TABLET    Take 1 tablet (25 mg total) by mouth every 6 (six) hours as needed for itching.   IBUPROFEN  (ADVIL ,MOTRIN ) 800 MG TABLET    Take 1 tablet (800 mg total) by mouth 3 (three) times daily.   LANCET DEVICE MISC    Use to check blood sugars. May substitute to any manufacturer covered by patient's insurance.   LANCETS MISC. MISC    Use to check blood sugars 2 times daily.  May substitute to any manufacturer covered by patient's insurance.   LEVOCETIRIZINE (XYZAL ) 5 MG TABLET    Take 1 tablet (5 mg total) by mouth every evening.   LEVOTHYROXINE  (SYNTHROID ) 125 MCG TABLET    Take 1 tablet (125 mcg total) by mouth every morning.   LOSARTAN  (COZAAR ) 50 MG TABLET    Take 1 tablet by mouth once daily   MULTIPLE VITAMINS-MINERALS (MULTIVITAMINS THER. W/MINERALS) TABS    Take 1 tablet by mouth daily.     TRIAMCINOLONE  OINTMENT (KENALOG ) 0.5 %    Apply 1 Application topically 2 (two) times daily.   TURMERIC (QC TUMERIC COMPLEX PO)    Take by mouth daily.  Modified Medications   No medications on file  Discontinued Medications   INSULIN  GLARGINE (LANTUS  SOLOSTAR) 100 UNIT/ML SOLOSTAR PEN    Inject 100 Units into the skin daily.   INSULIN  LISPRO (HUMALOG ) 100 UNIT/ML INJECTION    Inject 0.1 mLs (10 Units total) into the skin 2 (two) times daily. Take 10-15 minutes prior to meals.    Allergies No Known Allergies  Past Medical History Past Medical History:  Diagnosis Date   Cataract Dec.2024   Diabetes mellitus    Diverticulitis    Gout    Hypercholesteremia    Hypertension    Kidney disease    Left foot pain 05/29/2022   Rash 07/30/2022   Thyroid  disease     Past Surgical History Past Surgical History:  Procedure Laterality Date   CERVICAL DISCECTOMY     EYE SURGERY     JOINT REPLACEMENT  1995    Family History family history includes Alcohol abuse in his brother and father; Anxiety disorder in his brother; Arthritis in his brother and father; COPD in his mother; Cancer in his brother and brother; Depression in his brother; Epilepsy in his daughter; Heart disease in his father; Heart failure in his father; Hyperlipidemia in his brother, father, mother, and sister; Hypertension in his brother, father, and sister; Stroke in his father.  Social History Social History   Socioeconomic History   Marital status: Divorced  Spouse name: Not on file    Number of children: 1   Years of education: 65   Highest education level: GED or equivalent  Occupational History   Not on file  Tobacco Use   Smoking status: Former    Current packs/day: 0.00    Types: Cigarettes    Quit date: 05/07/1991    Years since quitting: 32.5   Smokeless tobacco: Never  Vaping Use   Vaping status: Never Used  Substance and Sexual Activity   Alcohol use: Not Currently    Comment: Occasionally   Drug use: No   Sexual activity: Not Currently  Other Topics Concern   Not on file  Social History Narrative   Not on file   Social Drivers of Health   Financial Resource Strain: Medium Risk (07/27/2023)   Overall Financial Resource Strain (CARDIA)    Difficulty of Paying Living Expenses: Somewhat hard  Food Insecurity: Food Insecurity Present (07/27/2023)   Hunger Vital Sign    Worried About Running Out of Food in the Last Year: Sometimes true    Ran Out of Food in the Last Year: Never true  Transportation Needs: No Transportation Needs (07/27/2023)   PRAPARE - Administrator, Civil Service (Medical): No    Lack of Transportation (Non-Medical): No  Physical Activity: Insufficiently Active (07/27/2023)   Exercise Vital Sign    Days of Exercise per Week: 2 days    Minutes of Exercise per Session: 20 min  Stress: No Stress Concern Present (07/27/2023)   [employer] of Occupational Health - Occupational Stress Questionnaire    Feeling of Stress : Not at all  Social Connections: Socially Isolated (07/27/2023)   Social Connection and Isolation Panel [NHANES]    Frequency of Communication with Friends and Family: More than three times a week    Frequency of Social Gatherings with Friends and Family: More than three times a week    Attends Religious Services: Never    Database administrator or Organizations: No    Attends Banker Meetings: Never    Marital Status: Divorced  Catering manager Violence: Not At Risk (02/11/2023)    Humiliation, Afraid, Rape, and Kick questionnaire    Fear of Current or Ex-Partner: No    Emotionally Abused: No    Physically Abused: No    Sexually Abused: No    Lab Results  Component Value Date   HGBA1C 8.8 (A) 11/18/2023   HGBA1C 9.2 (H) 10/26/2023   HGBA1C 9.0 (H) 07/28/2023   Lab Results  Component Value Date   CHOL 204 (H) 10/26/2023   Lab Results  Component Value Date   HDL 31.10 (L) 10/26/2023   Lab Results  Component Value Date   LDLCALC 91 01/04/2023   Lab Results  Component Value Date   TRIG (H) 10/26/2023    437.0 Triglyceride is over 400; calculations on Lipids are invalid.   Lab Results  Component Value Date   CHOLHDL 7 10/26/2023   Lab Results  Component Value Date   CREATININE 1.21 10/26/2023   Lab Results  Component Value Date   GFR 62.41 10/26/2023   Lab Results  Component Value Date   MICROALBUR 3.6 (H) 07/28/2023      Component Value Date/Time   NA 137 10/26/2023 0903   NA 142 04/08/2023 0830   K 3.9 10/26/2023 0903   CL 101 10/26/2023 0903   CO2 26 10/26/2023 0903   GLUCOSE 170 (H) 10/26/2023 0454  BUN 29 (H) 10/26/2023 0903   BUN 28 (H) 04/08/2023 0830   CREATININE 1.21 10/26/2023 0903   CALCIUM 10.0 10/26/2023 0903   PROT 7.9 10/26/2023 0903   PROT 6.9 04/08/2023 0830   ALBUMIN 4.6 10/26/2023 0903   ALBUMIN 4.3 04/08/2023 0830   AST 27 10/26/2023 0903   ALT 39 10/26/2023 0903   ALKPHOS 56 10/26/2023 0903   BILITOT 0.4 10/26/2023 0903   BILITOT <0.2 04/08/2023 0830   GFRNONAA 52 (L) 01/04/2022 1303      Latest Ref Rng & Units 10/26/2023    9:03 AM 07/28/2023    2:02 PM 04/08/2023    8:30 AM  BMP  Glucose 70 - 99 mg/dL 562  130  865   BUN 6 - 23 mg/dL 29  23  28    Creatinine 0.40 - 1.50 mg/dL 7.84  6.96  2.95   BUN/Creat Ratio 10 - 24   23   Sodium 135 - 145 mEq/L 137  137  142   Potassium 3.5 - 5.1 mEq/L 3.9  4.1  4.1   Chloride 96 - 112 mEq/L 101  97  101   CO2 19 - 32 mEq/L 26  31  23    Calcium 8.4 - 10.5 mg/dL  28.4  9.9  9.9        Component Value Date/Time   WBC 8.7 10/26/2023 0903   RBC 5.06 10/26/2023 0903   HGB 14.8 10/26/2023 0903   HGB 13.6 04/08/2023 0830   HCT 44.7 10/26/2023 0903   HCT 40.7 04/08/2023 0830   PLT 277.0 10/26/2023 0903   PLT 247 04/08/2023 0830   MCV 88.4 10/26/2023 0903   MCV 90 04/08/2023 0830   MCH 30.0 04/08/2023 0830   MCH 30.5 01/04/2022 1303   MCHC 33.2 10/26/2023 0903   RDW 13.7 10/26/2023 0903   RDW 13.0 04/08/2023 0830   LYMPHSABS 2.2 10/26/2023 0903   LYMPHSABS 2.2 04/08/2023 0830   MONOABS 0.8 10/26/2023 0903   EOSABS 0.2 10/26/2023 0903   EOSABS 0.2 04/08/2023 0830   BASOSABS 0.0 10/26/2023 0903   BASOSABS 0.0 04/08/2023 0830     Parts of this note may have been dictated using voice recognition software. There may be variances in spelling and vocabulary which are unintentional. Not all errors are proofread. Please notify the [facility] if any discrepancies are noted or if the meaning of any statement is not clear.

## 2023-12-01 NOTE — Patient Instructions (Signed)

## 2023-12-07 ENCOUNTER — Encounter: Payer: Self-pay | Admitting: "Endocrinology

## 2023-12-07 ENCOUNTER — Telehealth (INDEPENDENT_AMBULATORY_CARE_PROVIDER_SITE_OTHER): Admitting: "Endocrinology

## 2023-12-07 ENCOUNTER — Encounter: Attending: "Endocrinology | Admitting: Nutrition

## 2023-12-07 DIAGNOSIS — E782 Mixed hyperlipidemia: Secondary | ICD-10-CM | POA: Diagnosis not present

## 2023-12-07 DIAGNOSIS — E1165 Type 2 diabetes mellitus with hyperglycemia: Secondary | ICD-10-CM | POA: Insufficient documentation

## 2023-12-07 DIAGNOSIS — Z794 Long term (current) use of insulin: Secondary | ICD-10-CM

## 2023-12-07 DIAGNOSIS — Z7984 Long term (current) use of oral hypoglycemic drugs: Secondary | ICD-10-CM | POA: Diagnosis not present

## 2023-12-07 NOTE — Progress Notes (Signed)
 The patient reports they are currently: Earl Harvey. I spent 9-10 minutes on the video with the patient on the date of service. I spent an additional 5 minutes on pre- and post-visit activities on the date of service.   The patient was physically located in [place]  or a state in which I am permitted to provide care. The patient and/or parent/guardian understood that s/he may incur co-pays and cost sharing, and agreed to the telemedicine visit. The visit was reasonable and appropriate under the circumstances given the patient's presentation at the time.  The patient and/or parent/guardian has been advised of the potential risks and limitations of this mode of treatment (including, but not limited to, the absence of in-person examination) and has agreed to be treated using telemedicine. The patient's/patient's family's questions regarding telemedicine have been answered.   The patient and/or parent/guardian has also been advised to contact their provider's office for worsening conditions, and seek emergency medical treatment and/or call 911 if the patient deems either necessary.    Outpatient Endocrinology Note [name], MD  12/07/23   Earl Harvey 03-22-1956 [id number]  Referring Provider: Abram Abraham, NP-C Primary Care Provider: Abram Abraham, NP-C Reason for consultation: Subjective   Assessment & Plan  Diagnoses and all orders for this visit:  Uncontrolled type 2 diabetes mellitus with hyperglycemia (HCC)  Long term (current) use of oral hypoglycemic drugs  Long-term insulin  use (HCC)  Mixed hypercholesterolemia and hypertriglyceridemia    Diabetes Type II complicated by neuropathy,  Lab Results  Component Value Date   GFR 62.41 10/26/2023   Hba1c goal less than 7, current Hba1c is  Lab Results  Component Value Date   HGBA1C 8.8 (A) 11/18/2023   Will recommend the following: U500 INSULIN : 75 units before break fast, 55 units before lunch and 60-70 units  before supper - 30 min before meals  Glimepiride  2 mg bid Ordered DM education  Patient has been playing with his insulin  doses, to 50 units of Lantus  yesterday instead of 100 units, instructed against it We stopped Lantus  100 units every day and stopped Humalog  24 units before break fast and lunch and 28 units before dinner - 15 min before meals   No known contraindications/side effects to any of above medications Glucagon  discussed and prescribed with refills on 11/18/23   Not interested in GLP1, calls them "black box/deadly"  -Last LD and Tg are as follows: Lab Results  Component Value Date   LDLCALC 91 01/04/2023    Lab Results  Component Value Date   TRIG (H) 10/26/2023    437.0 Triglyceride is over 400; calculations on Lipids are invalid.   -not on statin (11/18/23: reports problems in the past and refuses to take it), on ezetimibe  10 mg every day and fish oil-omega-3 fatty acids 1000 MG capsule 2 gms daily  -Follow low fat diet and exercise   -Blood pressure goal <140/90 - Microalbumin/creatinine goal is < 30 -Last MA/Cr is as follows: Lab Results  Component Value Date   MICROALBUR 3.6 (H) 07/28/2023   -on ACE/ARB losartan  50 mg every day  -diet changes including salt restriction -limit eating outside -counseled BP targets per standards of diabetes care -uncontrolled blood pressure can lead to retinopathy, nephropathy and cardiovascular and atherosclerotic heart disease  Reviewed and counseled on: -A1C target -Blood sugar targets -Complications of uncontrolled diabetes  -Checking blood sugar before meals and bedtime and bring log next visit -All medications with mechanism of action and side effects -Hypoglycemia management:  rule of 15's, Glucagon  Emergency Kit and medical alert ID -low-carb low-fat plate-method diet -At least 20 minutes of physical activity per day -Annual dilated retinal eye exam and foot exam -compliance and follow up needs -follow up as  scheduled or earlier if problem gets worse  Call if blood sugar is less than 70 or consistently above 250    Take a 15 gm snack of carbohydrate at bedtime before you go to sleep if your blood sugar is less than 100.    If you are going to fast after midnight for a test or procedure, ask your physician for instructions on how to reduce/decrease your insulin  dose.    Call if blood sugar is less than 70 or consistently above 250  -Treating a low sugar by rule of 15  (15 gms of sugar every 15 min until sugar is more than 70) If you feel your sugar is low, test your sugar to be sure If your sugar is low (less than 70), then take 15 grams of a fast acting Carbohydrate (3-4 glucose tablets or glucose gel or 4 ounces of juice or regular soda) Recheck your sugar 15 min after treating low to make sure it is more than 70 If sugar is still less than 70, treat again with 15 grams of carbohydrate          Don't drive the hour of hypoglycemia  If unconscious/unable to eat or drink by mouth, use glucagon  injection or nasal spray baqsimi  and call 911. Can repeat again in 15 min if still unconscious.  No follow-ups on file.   I have reviewed current medications, nurse's notes, allergies, vital signs, past medical and surgical history, family medical history, and social history for this encounter. Counseled patient on symptoms, examination findings, lab findings, imaging results, treatment decisions and monitoring and prognosis. The patient understood the recommendations and agrees with the treatment plan. All questions regarding treatment plan were fully answered.  [name], MD  12/07/23    History of Present Illness Earl Harvey is a 68 y.o. year old male who presents for follow of Type II diabetes mellitus.  Earl Harvey was first diagnosed before 1995.   Diabetes education +  Home diabetes regimen: U500 INSULIN : 75 units before break fast, 55 units before lunch and 55-70 units before  supper - 30 min before meals  Glimepiride  2 mg bid  Glimepiride  2 mg bid Previously, Lantus  100 units every day Humalog  22-24 units tidac - 15 min before meals   COMPLICATIONS -  MI/Stroke -  retinopathy +  neuropathy -  nephropathy  BLOOD SUGAR DATA 85-352 per recall, high in the morning   Physical Exam  There were no vitals taken for this visit.   Constitutional: well developed, well nourished Head: normocephalic, atraumatic Eyes: sclera anicteric, no redness Neck: supple Lungs: normal respiratory effort Neurology: alert and oriented Skin: dry, no appreciable rashes Musculoskeletal: no appreciable defects Psychiatric: normal mood and affect Diabetic Foot Exam - Simple   No data filed      Current Medications Patient's Medications  New Prescriptions   No medications on file  Previous Medications   ALLOPURINOL  (ZYLOPRIM ) 300 MG TABLET    Take 1 tablet (300 mg total) by mouth every other day.   ASPIRIN EC 81 MG TABLET    Take 81 mg by mouth daily. Swallow whole.   BLOOD GLUCOSE MONITORING SUPPL DEVI    Use to check blood sugars. May substitute to any manufacturer covered  by patient's insurance.   CHLORTHALIDONE  (HYGROTON ) 25 MG TABLET    Take 1 tablet (25 mg total) by mouth daily.   CHOLECALCIFEROL (VITAMIN D3) 125 MCG (5000 UT) TABS    Take 1 tablet by mouth daily.   CONTINUOUS GLUCOSE SENSOR (DEXCOM G7 SENSOR) MISC    1 Device by Does not apply route continuous.   DAPAGLIFLOZIN  PROPANEDIOL (FARXIGA ) 5 MG TABS TABLET    Take 1 tablet (5 mg total) by mouth daily before breakfast.   EZETIMIBE  (ZETIA ) 10 MG TABLET    Take 1 tablet by mouth once daily   FISH OIL-OMEGA-3 FATTY ACIDS 1000 MG CAPSULE    Take 2 g by mouth daily.     GARLIC 500 MG TABS    Take 1 tablet by mouth daily.     GLIMEPIRIDE  (AMARYL ) 2 MG TABLET    Take 1 tablet (2 mg total) by mouth 2 (two) times daily with a meal.   GLUCAGON  (BAQSIMI  ONE PACK) 3 MG/DOSE POWD    Place 1 Device into the nose as  needed (Low blood sugar with impaired consciousness).   GLUCOSE BLOOD (BLOOD GLUCOSE TEST STRIPS) STRP    Use to check blood sugars 2 times daily. May substitute to any manufacturer covered by patient's insurance.   HYDROXYZINE  (ATARAX ) 25 MG TABLET    Take 1 tablet (25 mg total) by mouth every 6 (six) hours as needed for itching.   IBUPROFEN  (ADVIL ,MOTRIN ) 800 MG TABLET    Take 1 tablet (800 mg total) by mouth 3 (three) times daily.   INSULIN  REGULAR HUMAN CONCENTRATED (HUMULIN  R U-500 KWIKPEN) 500 UNIT/ML KWIKPEN    70 units before break fast, 50 units before lunch and 50 units before supper - 30 min before meals   LANCET DEVICE MISC    Use to check blood sugars. May substitute to any manufacturer covered by patient's insurance.   LANCETS MISC. MISC    Use to check blood sugars 2 times daily. May substitute to any manufacturer covered by patient's insurance.   LEVOCETIRIZINE (XYZAL ) 5 MG TABLET    Take 1 tablet (5 mg total) by mouth every evening.   LEVOTHYROXINE  (SYNTHROID ) 125 MCG TABLET    Take 1 tablet (125 mcg total) by mouth every morning.   LOSARTAN  (COZAAR ) 50 MG TABLET    Take 1 tablet by mouth once daily   MULTIPLE VITAMINS-MINERALS (MULTIVITAMINS THER. W/MINERALS) TABS    Take 1 tablet by mouth daily.     TRIAMCINOLONE  OINTMENT (KENALOG ) 0.5 %    Apply 1 Application topically 2 (two) times daily.   TURMERIC (QC TUMERIC COMPLEX PO)    Take by mouth daily.  Modified Medications   No medications on file  Discontinued Medications   No medications on file    Allergies No Known Allergies  Past Medical History Past Medical History:  Diagnosis Date   Cataract Dec.2024   Diabetes mellitus    Diverticulitis    Gout    Hypercholesteremia    Hypertension    Kidney disease    Left foot pain 05/29/2022   Rash 07/30/2022   Thyroid  disease     Past Surgical History Past Surgical History:  Procedure Laterality Date   CERVICAL DISCECTOMY     EYE SURGERY     JOINT REPLACEMENT   1995    Family History family history includes Alcohol abuse in his brother and father; Anxiety disorder in his brother; Arthritis in his brother and father; COPD in his mother; Cancer  in his brother and brother; Depression in his brother; Epilepsy in his daughter; Heart disease in his father; Heart failure in his father; Hyperlipidemia in his brother, father, mother, and sister; Hypertension in his brother, father, and sister; Stroke in his father.  Social History Social History   Socioeconomic History   Marital status: Divorced    Spouse name: Not on file   Number of children: 1   Years of education: 13   Highest education level: GED or equivalent  Occupational History   Not on file  Tobacco Use   Smoking status: Former    Current packs/day: 0.00    Types: Cigarettes    Quit date: 05/07/1991    Years since quitting: 32.6   Smokeless tobacco: Never  Vaping Use   Vaping status: Never Used  Substance and Sexual Activity   Alcohol use: Not Currently    Comment: Occasionally   Drug use: No   Sexual activity: Not Currently  Other Topics Concern   Not on file  Social History Narrative   Not on file   Social Drivers of Health   Financial Resource Strain: Medium Risk (07/27/2023)   Overall Financial Resource Strain (CARDIA)    Difficulty of Paying Living Expenses: Somewhat hard  Food Insecurity: Food Insecurity Present (07/27/2023)   Hunger Vital Sign    Worried About Running Out of Food in the Last Year: Sometimes true    Ran Out of Food in the Last Year: Never true  Transportation Needs: No Transportation Needs (07/27/2023)   PRAPARE - Administrator, Civil Service (Medical): No    Lack of Transportation (Non-Medical): No  Physical Activity: Insufficiently Active (07/27/2023)   Exercise Vital Sign    Days of Exercise per Week: 2 days    Minutes of Exercise per Session: 20 min  Stress: No Stress Concern Present (07/27/2023)   [employer] of Occupational  Health - Occupational Stress Questionnaire    Feeling of Stress : Not at all  Social Connections: Socially Isolated (07/27/2023)   Social Connection and Isolation Panel [NHANES]    Frequency of Communication with Friends and Family: More than three times a week    Frequency of Social Gatherings with Friends and Family: More than three times a week    Attends Religious Services: Never    Database administrator or Organizations: No    Attends Banker Meetings: Never    Marital Status: Divorced  Catering manager Violence: Not At Risk (02/11/2023)   Humiliation, Afraid, Rape, and Kick questionnaire    Fear of Current or Ex-Partner: No    Emotionally Abused: No    Physically Abused: No    Sexually Abused: No    Lab Results  Component Value Date   HGBA1C 8.8 (A) 11/18/2023   HGBA1C 9.2 (H) 10/26/2023   HGBA1C 9.0 (H) 07/28/2023   Lab Results  Component Value Date   CHOL 204 (H) 10/26/2023   Lab Results  Component Value Date   HDL 31.10 (L) 10/26/2023   Lab Results  Component Value Date   LDLCALC 91 01/04/2023   Lab Results  Component Value Date   TRIG (H) 10/26/2023    437.0 Triglyceride is over 400; calculations on Lipids are invalid.   Lab Results  Component Value Date   CHOLHDL 7 10/26/2023   Lab Results  Component Value Date   CREATININE 1.21 10/26/2023   Lab Results  Component Value Date   GFR 62.41 10/26/2023   Lab  Results  Component Value Date   MICROALBUR 3.6 (H) 07/28/2023      Component Value Date/Time   NA 137 10/26/2023 0903   NA 142 04/08/2023 0830   K 3.9 10/26/2023 0903   CL 101 10/26/2023 0903   CO2 26 10/26/2023 0903   GLUCOSE 170 (H) 10/26/2023 0903   BUN 29 (H) 10/26/2023 0903   BUN 28 (H) 04/08/2023 0830   CREATININE 1.21 10/26/2023 0903   CALCIUM 10.0 10/26/2023 0903   PROT 7.9 10/26/2023 0903   PROT 6.9 04/08/2023 0830   ALBUMIN 4.6 10/26/2023 0903   ALBUMIN 4.3 04/08/2023 0830   AST 27 10/26/2023 0903   ALT 39  10/26/2023 0903   ALKPHOS 56 10/26/2023 0903   BILITOT 0.4 10/26/2023 0903   BILITOT <0.2 04/08/2023 0830   GFRNONAA 52 (L) 01/04/2022 1303      Latest Ref Rng & Units 10/26/2023    9:03 AM 07/28/2023    2:02 PM 04/08/2023    8:30 AM  BMP  Glucose 70 - 99 mg/dL 784  696  295   BUN 6 - 23 mg/dL 29  23  28    Creatinine 0.40 - 1.50 mg/dL 2.84  1.32  4.40   BUN/Creat Ratio 10 - 24   23   Sodium 135 - 145 mEq/L 137  137  142   Potassium 3.5 - 5.1 mEq/L 3.9  4.1  4.1   Chloride 96 - 112 mEq/L 101  97  101   CO2 19 - 32 mEq/L 26  31  23    Calcium 8.4 - 10.5 mg/dL 10.2  9.9  9.9        Component Value Date/Time   WBC 8.7 10/26/2023 0903   RBC 5.06 10/26/2023 0903   HGB 14.8 10/26/2023 0903   HGB 13.6 04/08/2023 0830   HCT 44.7 10/26/2023 0903   HCT 40.7 04/08/2023 0830   PLT 277.0 10/26/2023 0903   PLT 247 04/08/2023 0830   MCV 88.4 10/26/2023 0903   MCV 90 04/08/2023 0830   MCH 30.0 04/08/2023 0830   MCH 30.5 01/04/2022 1303   MCHC 33.2 10/26/2023 0903   RDW 13.7 10/26/2023 0903   RDW 13.0 04/08/2023 0830   LYMPHSABS 2.2 10/26/2023 0903   LYMPHSABS 2.2 04/08/2023 0830   MONOABS 0.8 10/26/2023 0903   EOSABS 0.2 10/26/2023 0903   EOSABS 0.2 04/08/2023 0830   BASOSABS 0.0 10/26/2023 0903   BASOSABS 0.0 04/08/2023 0830     Parts of this note may have been dictated using voice recognition software. There may be variances in spelling and vocabulary which are unintentional. Not all errors are proofread. Please notify the [facility] if any discrepancies are noted or if the meaning of any statement is not clear.

## 2023-12-07 NOTE — Progress Notes (Signed)
 Patient is here today to review his diet and insulin  injections.   SBGM: Dexcom reader.  Insulin  dose: U 500R TID::AM: 75u, acL 55,  acS: 55u Exercise:  walking 2-3X/wk 1.5-1.6 miles Diet:  typical day: 6-7AM:  wake up 75u R insulin   2-3 cups of coffee 9-10AM; 2 egg sandwich with mayo  occasional baloney sandwich or corn beef.    Sugar free drink 1-2PM: 55u R  popcorn with butter, 1 gab, or sandwich with 12ounces milk 5-7PM: fried fish, baked potato, or shrimp fried with water to drink, or steak, or pork with mixed veg. Milk or sugar free juice, or water to drink Denies eating between meals or after supper.   Discussion:  3 different food groups needed in all meals: carbs protein and fats-  what foods fall into each category and need to limit amounts of each group because they all raise blood sugar.   SBGM: goals: less than 120 ac, and less than 160 2hr pc Need for exercise: try to increase this to 3-4X/.day Dietary suggestions:  limit fried foods to 1X/wk, and limit butter and sour cream when eating fried foods. Limit milk to no more than 8 ounces at one time. Gave much praise for not eating between meals and the fact that all meals are balanced. Believe understanding is good and motivation for better blood sugar control is good.  He was given my number to call if questions

## 2023-12-07 NOTE — Patient Instructions (Addendum)
 Will recommend the following: U500 INSULIN : 75 units before break fast, 55 units before lunch and 60-70 units before supper - 30 min before meals  Glimepiride  2 mg bid

## 2023-12-09 DIAGNOSIS — Z961 Presence of intraocular lens: Secondary | ICD-10-CM | POA: Diagnosis not present

## 2023-12-09 DIAGNOSIS — H40023 Open angle with borderline findings, high risk, bilateral: Secondary | ICD-10-CM | POA: Diagnosis not present

## 2023-12-09 DIAGNOSIS — H26491 Other secondary cataract, right eye: Secondary | ICD-10-CM | POA: Diagnosis not present

## 2023-12-09 DIAGNOSIS — H25812 Combined forms of age-related cataract, left eye: Secondary | ICD-10-CM | POA: Diagnosis not present

## 2023-12-09 DIAGNOSIS — Z794 Long term (current) use of insulin: Secondary | ICD-10-CM | POA: Diagnosis not present

## 2023-12-09 DIAGNOSIS — E113491 Type 2 diabetes mellitus with severe nonproliferative diabetic retinopathy without macular edema, right eye: Secondary | ICD-10-CM | POA: Diagnosis not present

## 2023-12-09 DIAGNOSIS — E113592 Type 2 diabetes mellitus with proliferative diabetic retinopathy without macular edema, left eye: Secondary | ICD-10-CM | POA: Diagnosis not present

## 2023-12-09 LAB — HM DIABETES EYE EXAM

## 2023-12-20 ENCOUNTER — Ambulatory Visit: Admitting: Urology

## 2023-12-22 NOTE — Progress Notes (Signed)
 Impression/Assessment:  1.  Elevated PSA--6.2.  High free to total ratio.  PSA 8 years ago not much lower.  I feel like this is related to his large prostate  2.  BPH with symptoms, fairly stable, he does not want medical therapy  3.  Nocturia  Plan:  1.  I will recheck PSA today  2.  I did discuss with him starting an alpha-blocker.  He would rather not take any new medicines at this time as he is on cranberry juice which she thinks has helped his symptoms  3.  I will see back in 6 months to recheck symptoms as well as a residual urine volume History of Present Illness:   8.12.2024: Initial visit for elevated PSA. Old PSA data reveals PSA to be 2.5 in September, 2009.  Just over a month ago, PSA was 6.7, free to total ratio [id number].SABRA  He states that he was seen by urologist in [facility] 10 to 15 years ago.  Apparently for PSA issues.  He has not been seen in years.  IPSS 15/4. .  12.17.2024: PSA 6.2.  We did receive/find records from his visit with Dr. Alfonzo in [facility] in 2016.  At that time, his PSA was in the 4.4 range.  IPSS 13/5.  Biggest issue is nocturia x 4, urgency and frequency.  He did get the overactive bladder guide sheet at his last visit.  That has not helped a lot.  6.23.2025: Stable urinary symptoms over the past 6 months.  IPSS 22/4.  He started drinking cranberry juice-unsweetened, and feels like he is voiding a little bit better.  Past Medical History:  Diagnosis Date   Cataract Dec.2024   Diabetes mellitus    Diverticulitis    Gout    Hypercholesteremia    Hypertension    Kidney disease    Left foot pain 05/29/2022   Rash 07/30/2022   Thyroid  disease     Past Surgical History:  Procedure Laterality Date   CERVICAL DISCECTOMY     EYE SURGERY     JOINT REPLACEMENT  1995    Home Medications:  Allergies as of 12/27/2023   No Known Allergies      Medication List        Accurate as of December 22, 2023  1:18 PM. If you have any questions, ask your  nurse or doctor.          allopurinol  300 MG tablet Commonly known as: ZYLOPRIM  Take 1 tablet (300 mg total) by mouth every other day.   aspirin EC 81 MG tablet Take 81 mg by mouth daily. Swallow whole.   Baqsimi  One Pack 3 MG/DOSE Powd Generic drug: Glucagon  Place 1 Device into the nose as needed (Low blood sugar with impaired consciousness).   Blood Glucose Monitoring Suppl Devi Use to check blood sugars. May substitute to any manufacturer covered by patient's insurance.   BLOOD GLUCOSE TEST STRIPS Strp Use to check blood sugars 2 times daily. May substitute to any manufacturer covered by patient's insurance.   chlorthalidone  25 MG tablet Commonly known as: HYGROTON  Take 1 tablet (25 mg total) by mouth daily.   dapagliflozin  propanediol 5 MG Tabs tablet Commonly known as: Farxiga  Take 1 tablet (5 mg total) by mouth daily before breakfast.   Dexcom G7 Sensor Misc 1 Device by Does not apply route continuous.   ezetimibe  10 MG tablet Commonly known as: ZETIA  Take 1 tablet by mouth once daily   fish oil-omega-3 fatty acids  1000 MG capsule Take 2 g by mouth daily.   Garlic 500 MG Tabs Take 1 tablet by mouth daily.   glimepiride  2 MG tablet Commonly known as: AMARYL  Take 1 tablet (2 mg total) by mouth 2 (two) times daily with a meal.   HumuLIN  R U-500 KwikPen 500 UNIT/ML KwikPen Generic drug: insulin  regular human CONCENTRATED 70 units before break fast, 50 units before lunch and 50 units before supper - 30 min before meals What changed: additional instructions   hydrOXYzine  25 MG tablet Commonly known as: ATARAX  Take 1 tablet (25 mg total) by mouth every 6 (six) hours as needed for itching.   ibuprofen  800 MG tablet Commonly known as: ADVIL  Take 1 tablet (800 mg total) by mouth 3 (three) times daily.   Lancet Device Misc Use to check blood sugars. May substitute to any manufacturer covered by patient's insurance.   Lancets Misc. Misc Use to check blood  sugars 2 times daily. May substitute to any manufacturer covered by patient's insurance.   levocetirizine 5 MG tablet Commonly known as: XYZAL  Take 1 tablet (5 mg total) by mouth every evening.   levothyroxine  125 MCG tablet Commonly known as: SYNTHROID  Take 1 tablet (125 mcg total) by mouth every morning.   losartan  50 MG tablet Commonly known as: COZAAR  Take 1 tablet by mouth once daily   multivitamins ther. w/minerals Tabs tablet Take 1 tablet by mouth daily.   QC TUMERIC COMPLEX PO Take by mouth daily.   triamcinolone  ointment 0.5 % Commonly known as: KENALOG  Apply 1 Application topically 2 (two) times daily.   Vitamin D3 125 MCG (5000 UT) Tabs Take 1 tablet by mouth daily.        Allergies: No Known Allergies  Family History  Problem Relation Age of Onset   Hyperlipidemia Mother    COPD Mother    Hypertension Father    Hyperlipidemia Father    Arthritis Father    Alcohol abuse Father    Stroke Father    Heart failure Father    Heart disease Father    Hypertension Sister    Hyperlipidemia Sister    Hypertension Brother    Hyperlipidemia Brother    Depression Brother    Cancer Brother        lung   Arthritis Brother    Anxiety disorder Brother    Alcohol abuse Brother    Epilepsy Daughter    Cancer Brother     Social History:  reports that he quit smoking about 32 years ago. His smoking use included cigarettes. He has never used smokeless tobacco. He reports that he does not currently use alcohol. He reports that he does not use drugs.  ROS: A complete review of systems was performed.  All systems are negative except for pertinent findings as noted.  Physical Exam:  Vital signs in last 24 hours: There were no vitals taken for this visit. Constitutional:  Alert and oriented, No acute distress Cardiovascular: Regular rate  Respiratory: Normal respiratory effort Neurologic: Grossly intact, no focal deficits Psychiatric: Normal mood and affect  I  have reviewed notes from referring/previous physicians--[facility] notes  I have reviewed urinalysis results  I have reviewed prior total and percent free PSA results  I have reviewed IPSS form  Residual urine volume today 216 mL

## 2023-12-22 NOTE — Patient Instructions (Signed)
 Limit fried foods to 1X/wk, and limit butter and sour cream when eating fried foods. Limit milk to no more than 8 ounces at one time. Increase walking to 3-4 times per week for 20-30 minutes

## 2023-12-27 ENCOUNTER — Telehealth: Payer: Self-pay

## 2023-12-27 ENCOUNTER — Other Ambulatory Visit: Payer: Self-pay

## 2023-12-27 ENCOUNTER — Ambulatory Visit (INDEPENDENT_AMBULATORY_CARE_PROVIDER_SITE_OTHER): Admitting: Urology

## 2023-12-27 VITALS — BP 123/74 | HR 82 | Ht 68.0 in | Wt 220.0 lb

## 2023-12-27 DIAGNOSIS — R351 Nocturia: Secondary | ICD-10-CM | POA: Diagnosis not present

## 2023-12-27 DIAGNOSIS — N401 Enlarged prostate with lower urinary tract symptoms: Secondary | ICD-10-CM | POA: Diagnosis not present

## 2023-12-27 DIAGNOSIS — N138 Other obstructive and reflux uropathy: Secondary | ICD-10-CM | POA: Diagnosis not present

## 2023-12-27 DIAGNOSIS — R972 Elevated prostate specific antigen [PSA]: Secondary | ICD-10-CM | POA: Diagnosis not present

## 2023-12-27 DIAGNOSIS — E1165 Type 2 diabetes mellitus with hyperglycemia: Secondary | ICD-10-CM

## 2023-12-27 LAB — URINALYSIS, ROUTINE W REFLEX MICROSCOPIC
Bilirubin, UA: NEGATIVE
Glucose, UA: NEGATIVE
Ketones, UA: NEGATIVE
Leukocytes,UA: NEGATIVE
Nitrite, UA: NEGATIVE
RBC, UA: NEGATIVE
Specific Gravity, UA: 1.015 (ref 1.005–1.030)
Urobilinogen, Ur: 0.2 mg/dL (ref 0.2–1.0)
pH, UA: 7 (ref 5.0–7.5)

## 2023-12-27 LAB — MICROSCOPIC EXAMINATION: Bacteria, UA: NONE SEEN

## 2023-12-27 LAB — BLADDER SCAN AMB NON-IMAGING: Scan Result: 216

## 2023-12-27 MED ORDER — HUMULIN R U-500 KWIKPEN 500 UNIT/ML ~~LOC~~ SOPN: PEN_INJECTOR | SUBCUTANEOUS | 3 refills | Status: AC

## 2023-12-27 NOTE — Telephone Encounter (Signed)
 Lvm for pt informing pt that I have refill his U500 insulin  has requested per voicemail message.

## 2023-12-28 LAB — PSA: Prostate Specific Ag, Serum: 5.5 ng/mL — ABNORMAL HIGH (ref 0.0–4.0)

## 2023-12-29 ENCOUNTER — Ambulatory Visit: Payer: Self-pay | Admitting: Urology

## 2023-12-31 ENCOUNTER — Ambulatory Visit (INDEPENDENT_AMBULATORY_CARE_PROVIDER_SITE_OTHER): Admitting: Family

## 2023-12-31 ENCOUNTER — Ambulatory Visit (INDEPENDENT_AMBULATORY_CARE_PROVIDER_SITE_OTHER): Admitting: "Endocrinology

## 2023-12-31 ENCOUNTER — Telehealth: Payer: Self-pay | Admitting: Family Medicine

## 2023-12-31 ENCOUNTER — Encounter: Payer: Self-pay | Admitting: Family

## 2023-12-31 ENCOUNTER — Encounter: Payer: Self-pay | Admitting: "Endocrinology

## 2023-12-31 VITALS — BP 130/80 | HR 85 | Ht 68.0 in | Wt 227.0 lb

## 2023-12-31 VITALS — BP 108/74 | HR 85 | Temp 97.9°F | Ht 68.0 in | Wt 229.0 lb

## 2023-12-31 DIAGNOSIS — Z7984 Long term (current) use of oral hypoglycemic drugs: Secondary | ICD-10-CM | POA: Diagnosis not present

## 2023-12-31 DIAGNOSIS — E782 Mixed hyperlipidemia: Secondary | ICD-10-CM

## 2023-12-31 DIAGNOSIS — Z794 Long term (current) use of insulin: Secondary | ICD-10-CM

## 2023-12-31 DIAGNOSIS — L739 Follicular disorder, unspecified: Secondary | ICD-10-CM

## 2023-12-31 DIAGNOSIS — E1165 Type 2 diabetes mellitus with hyperglycemia: Secondary | ICD-10-CM

## 2023-12-31 MED ORDER — CEPHALEXIN 500 MG PO CAPS: 500.0000 mg | ORAL_CAPSULE | Freq: Three times a day (TID) | ORAL | 0 refills | Status: DC

## 2023-12-31 MED ORDER — PRAVASTATIN SODIUM 10 MG PO TABS: 10.0000 mg | ORAL_TABLET | Freq: Every day | ORAL | 4 refills | Status: DC

## 2023-12-31 NOTE — Progress Notes (Signed)
 Outpatient Endocrinology Note Earl Birmingham, MD  12/31/23   Earl Harvey 1956/01/28 [id number]  Referring Provider: Lendia Boby CROME, NP-C Primary Care Provider: Lendia Boby CROME, NP-C Reason for consultation: Subjective   Assessment & Plan  Diagnoses and all orders for this visit:  Uncontrolled type 2 diabetes mellitus with hyperglycemia (HCC)  Long term (current) use of oral hypoglycemic drugs  Long-term insulin  use (HCC)  Mixed hypercholesterolemia and hypertriglyceridemia  Other orders -     pravastatin  (PRAVACHOL ) 10 MG tablet; Take 1 tablet (10 mg total) by mouth daily.   Diabetes Type II complicated by neuropathy,  Lab Results  Component Value Date   GFR 62.41 10/26/2023   Hba1c goal less than 7, current Hba1c is  Lab Results  Component Value Date   HGBA1C 8.8 (A) 11/18/2023   Will recommend the following: U500 INSULIN : 75 units before break fast, 70 units before lunch and 70 units before supper - 30 min before meals  Glimepiride  2 mg bid Ordered DM education  Patient has been playing with his insulin  doses, to 50 units of Lantus  yesterday instead of 100 units, instructed against it We stopped Lantus  100 units every day and stopped Humalog  24 units before break fast and lunch and 28 units before dinner - 15 min before meals   No known contraindications/side effects to any of above medications Glucagon  discussed and prescribed with refills on 11/18/23   Not interested in GLP1, calls them black box/deadly  -Last LD and Tg are as follows: Lab Results  Component Value Date   LDLCALC 91 01/04/2023    Lab Results  Component Value Date   TRIG (H) 10/26/2023    437.0 Triglyceride is over 400; calculations on Lipids are invalid.   -not on statin (11/18/23: reports problems in the past and refuses to take it), on ezetimibe  10 mg every day and fish oil-omega-3 fatty acids 1000 MG capsule 2 gms daily  -Follow low fat diet and exercise   -Blood pressure  goal <140/90 - Microalbumin/creatinine goal is < 30 -Last MA/Cr is as follows: Lab Results  Component Value Date   MICROALBUR 3.6 (H) 07/28/2023   -on ACE/ARB losartan  50 mg every day  -diet changes including salt restriction -limit eating outside -counseled BP targets per standards of diabetes care -uncontrolled blood pressure can lead to retinopathy, nephropathy and cardiovascular and atherosclerotic heart disease  Reviewed and counseled on: -A1C target -Blood sugar targets -Complications of uncontrolled diabetes  -Checking blood sugar before meals and bedtime and bring log next visit -All medications with mechanism of action and side effects -Hypoglycemia management: rule of 15's, Glucagon  Emergency Kit and medical alert ID -low-carb low-fat plate-method diet -At least 20 minutes of physical activity per day -Annual dilated retinal eye exam and foot exam -compliance and follow up needs -follow up as scheduled or earlier if problem gets worse  Call if blood sugar is less than 70 or consistently above 250    Take a 15 gm snack of carbohydrate at bedtime before you go to sleep if your blood sugar is less than 100.    If you are going to fast after midnight for a test or procedure, ask your physician for instructions on how to reduce/decrease your insulin  dose.    Call if blood sugar is less than 70 or consistently above 250  -Treating a low sugar by rule of 15  (15 gms of sugar every 15 min until sugar is more than 70) If you  feel your sugar is low, test your sugar to be sure If your sugar is low (less than 70), then take 15 grams of a fast acting Carbohydrate (3-4 glucose tablets or glucose gel or 4 ounces of juice or regular soda) Recheck your sugar 15 min after treating low to make sure it is more than 70 If sugar is still less than 70, treat again with 15 grams of carbohydrate          Don't drive the hour of hypoglycemia  If unconscious/unable to eat or drink by mouth,  use glucagon  injection or nasal spray baqsimi  and call 911. Can repeat again in 15 min if still unconscious.  Return in about 2 months (around 03/01/2024).   I have reviewed current medications, nurse's notes, allergies, vital signs, past medical and surgical history, family medical history, and social history for this encounter. Counseled patient on symptoms, examination findings, lab findings, imaging results, treatment decisions and monitoring and prognosis. The patient understood the recommendations and agrees with the treatment plan. All questions regarding treatment plan were fully answered.  Earl Birmingham, MD  12/31/23    History of Present Illness Earl Harvey is a 68 y.o. year old male who presents for follow of Type II diabetes mellitus.  Earl Harvey was first diagnosed before 1995.   Diabetes education +  Home diabetes regimen: U500 INSULIN : 70 units before break fast, 65 units before lunch and 65 units before supper - 30 min before meals  Glimepiride  2 mg bid  Glimepiride  2 mg bid Previously, Lantus  100 units every day Humalog  22-24 units tidac - 15 min before meals   COMPLICATIONS -  MI/Stroke -  retinopathy +  neuropathy -  nephropathy  BLOOD SUGAR DATA CGM interpretation: At today's visit, we reviewed her CGM downloads. The full report is scanned in the media. Reviewing the CGM trends, BG are elevated after all meals/snacks/drinks.  Physical Exam  BP 130/80   Pulse 85   Ht 5' 8 (1.727 m)   Wt 227 lb (103 kg)   SpO2 98%   BMI 34.52 kg/m    Constitutional: well developed, well nourished Head: normocephalic, atraumatic Eyes: sclera anicteric, no redness Neck: supple Lungs: normal respiratory effort Neurology: alert and oriented Skin: dry, no appreciable rashes Musculoskeletal: no appreciable defects Psychiatric: normal mood and affect Diabetic Foot Exam - Simple   No data filed      Current Medications Patient's Medications  New  Prescriptions   PRAVASTATIN  (PRAVACHOL ) 10 MG TABLET    Take 1 tablet (10 mg total) by mouth daily.  Previous Medications   ALLOPURINOL  (ZYLOPRIM ) 300 MG TABLET    Take 1 tablet (300 mg total) by mouth every other day.   ASPIRIN EC 81 MG TABLET    Take 81 mg by mouth daily. Swallow whole.   BLOOD GLUCOSE MONITORING SUPPL DEVI    Use to check blood sugars. May substitute to any manufacturer covered by patient's insurance.   CHLORTHALIDONE  (HYGROTON ) 25 MG TABLET    Take 1 tablet (25 mg total) by mouth daily.   CHOLECALCIFEROL (VITAMIN D3) 125 MCG (5000 UT) TABS    Take 1 tablet by mouth daily.   CONTINUOUS GLUCOSE SENSOR (DEXCOM G7 SENSOR) MISC    1 Device by Does not apply route continuous.   DAPAGLIFLOZIN  PROPANEDIOL (FARXIGA ) 5 MG TABS TABLET    Take 1 tablet (5 mg total) by mouth daily before breakfast.   EZETIMIBE  (ZETIA ) 10 MG TABLET  Take 1 tablet by mouth once daily   FISH OIL-OMEGA-3 FATTY ACIDS 1000 MG CAPSULE    Take 2 g by mouth daily.     GARLIC 500 MG TABS    Take 1 tablet by mouth daily.     GLIMEPIRIDE  (AMARYL ) 2 MG TABLET    Take 1 tablet (2 mg total) by mouth 2 (two) times daily with a meal.   GLUCAGON  (BAQSIMI  ONE PACK) 3 MG/DOSE POWD    Place 1 Device into the nose as needed (Low blood sugar with impaired consciousness).   GLUCOSE BLOOD (BLOOD GLUCOSE TEST STRIPS) STRP    Use to check blood sugars 2 times daily. May substitute to any manufacturer covered by patient's insurance.   HYDROXYZINE  (ATARAX ) 25 MG TABLET    Take 1 tablet (25 mg total) by mouth every 6 (six) hours as needed for itching.   IBUPROFEN  (ADVIL ,MOTRIN ) 800 MG TABLET    Take 1 tablet (800 mg total) by mouth 3 (three) times daily.   INSULIN  REGULAR HUMAN CONCENTRATED (HUMULIN  R U-500 KWIKPEN) 500 UNIT/ML KWIKPEN    75 units before break fast, 55 units before lunch and 55 units before supper - 30 min before meals   LANCET DEVICE MISC    Use to check blood sugars. May substitute to any manufacturer covered by  patient's insurance.   LANCETS MISC. MISC    Use to check blood sugars 2 times daily. May substitute to any manufacturer covered by patient's insurance.   LEVOCETIRIZINE (XYZAL ) 5 MG TABLET    Take 1 tablet (5 mg total) by mouth every evening.   LEVOTHYROXINE  (SYNTHROID ) 125 MCG TABLET    Take 1 tablet (125 mcg total) by mouth every morning.   LOSARTAN  (COZAAR ) 50 MG TABLET    Take 1 tablet by mouth once daily   MULTIPLE VITAMINS-MINERALS (MULTIVITAMINS THER. W/MINERALS) TABS    Take 1 tablet by mouth daily.     TRIAMCINOLONE  OINTMENT (KENALOG ) 0.5 %    Apply 1 Application topically 2 (two) times daily.   TURMERIC (QC TUMERIC COMPLEX PO)    Take by mouth daily.  Modified Medications   No medications on file  Discontinued Medications   No medications on file    Allergies No Known Allergies  Past Medical History Past Medical History:  Diagnosis Date   Cataract Dec.2024   Diabetes mellitus    Diverticulitis    Gout    Hypercholesteremia    Hypertension    Kidney disease    Left foot pain 05/29/2022   Rash 07/30/2022   Thyroid  disease     Past Surgical History Past Surgical History:  Procedure Laterality Date   CERVICAL DISCECTOMY     EYE SURGERY     JOINT REPLACEMENT  1995    Family History family history includes Alcohol abuse in his brother and father; Anxiety disorder in his brother; Arthritis in his brother and father; COPD in his mother; Cancer in his brother and brother; Depression in his brother; Epilepsy in his daughter; Heart disease in his father; Heart failure in his father; Hyperlipidemia in his brother, father, mother, and sister; Hypertension in his brother, father, and sister; Stroke in his father.  Social History Social History   Socioeconomic History   Marital status: Divorced    Spouse name: Not on file   Number of children: 1   Years of education: 13   Highest education level: 12th grade  Occupational History   Not on file  Tobacco Use  Smoking  status: Former    Current packs/day: 0.00    Types: Cigarettes    Quit date: 05/07/1991    Years since quitting: 32.6   Smokeless tobacco: Never  Vaping Use   Vaping status: Never Used  Substance and Sexual Activity   Alcohol use: Not Currently    Comment: Occasionally   Drug use: No   Sexual activity: Not Currently  Other Topics Concern   Not on file  Social History Narrative   Not on file   Social Drivers of Health   Financial Resource Strain: High Risk (12/31/2023)   Overall Financial Resource Strain (CARDIA)    Difficulty of Paying Living Expenses: Hard  Food Insecurity: No Food Insecurity (12/31/2023)   Hunger Vital Sign    Worried About Running Out of Food in the Last Year: Never true    Ran Out of Food in the Last Year: Never true  Transportation Needs: No Transportation Needs (12/31/2023)   PRAPARE - Administrator, Civil Service (Medical): No    Lack of Transportation (Non-Medical): No  Physical Activity: Sufficiently Active (12/31/2023)   Exercise Vital Sign    Days of Exercise per Week: 4 days    Minutes of Exercise per Session: 60 min  Stress: No Stress Concern Present (12/31/2023)   [employer] of Occupational Health - Occupational Stress Questionnaire    Feeling of Stress: Not at all  Social Connections: Moderately Isolated (12/31/2023)   Social Connection and Isolation Panel    Frequency of Communication with Friends and Family: More than three times a week    Frequency of Social Gatherings with Friends and Family: Once a week    Attends Religious Services: 1 to 4 times per year    Active Member of [employer] or Organizations: No    Attends Banker Meetings: Not on file    Marital Status: Divorced  Intimate Partner Violence: Not At Risk (02/11/2023)   Humiliation, Afraid, Rape, and Kick questionnaire    Fear of Current or Ex-Partner: No    Emotionally Abused: No    Physically Abused: No    Sexually Abused: No    Lab Results   Component Value Date   HGBA1C 8.8 (A) 11/18/2023   HGBA1C 9.2 (H) 10/26/2023   HGBA1C 9.0 (H) 07/28/2023   Lab Results  Component Value Date   CHOL 204 (H) 10/26/2023   Lab Results  Component Value Date   HDL 31.10 (L) 10/26/2023   Lab Results  Component Value Date   LDLCALC 91 01/04/2023   Lab Results  Component Value Date   TRIG (H) 10/26/2023    437.0 Triglyceride is over 400; calculations on Lipids are invalid.   Lab Results  Component Value Date   CHOLHDL 7 10/26/2023   Lab Results  Component Value Date   CREATININE 1.21 10/26/2023   Lab Results  Component Value Date   GFR 62.41 10/26/2023   Lab Results  Component Value Date   MICROALBUR 3.6 (H) 07/28/2023      Component Value Date/Time   NA 137 10/26/2023 0903   NA 142 04/08/2023 0830   K 3.9 10/26/2023 0903   CL 101 10/26/2023 0903   CO2 26 10/26/2023 0903   GLUCOSE 170 (H) 10/26/2023 0903   BUN 29 (H) 10/26/2023 0903   BUN 28 (H) 04/08/2023 0830   CREATININE 1.21 10/26/2023 0903   CALCIUM 10.0 10/26/2023 0903   PROT 7.9 10/26/2023 0903   PROT 6.9 04/08/2023 0830  ALBUMIN 4.6 10/26/2023 0903   ALBUMIN 4.3 04/08/2023 0830   AST 27 10/26/2023 0903   ALT 39 10/26/2023 0903   ALKPHOS 56 10/26/2023 0903   BILITOT 0.4 10/26/2023 0903   BILITOT <0.2 04/08/2023 0830   GFRNONAA 52 (L) 01/04/2022 1303      Latest Ref Rng & Units 10/26/2023    9:03 AM 07/28/2023    2:02 PM 04/08/2023    8:30 AM  BMP  Glucose 70 - 99 mg/dL 829  682  838   BUN 6 - 23 mg/dL 29  23  28    Creatinine 0.40 - 1.50 mg/dL 8.78  8.68  8.76   BUN/Creat Ratio 10 - 24   23   Sodium 135 - 145 mEq/L 137  137  142   Potassium 3.5 - 5.1 mEq/L 3.9  4.1  4.1   Chloride 96 - 112 mEq/L 101  97  101   CO2 19 - 32 mEq/L 26  31  23    Calcium 8.4 - 10.5 mg/dL 89.9  9.9  9.9        Component Value Date/Time   WBC 8.7 10/26/2023 0903   RBC 5.06 10/26/2023 0903   HGB 14.8 10/26/2023 0903   HGB 13.6 04/08/2023 0830   HCT 44.7  10/26/2023 0903   HCT 40.7 04/08/2023 0830   PLT 277.0 10/26/2023 0903   PLT 247 04/08/2023 0830   MCV 88.4 10/26/2023 0903   MCV 90 04/08/2023 0830   MCH 30.0 04/08/2023 0830   MCH 30.5 01/04/2022 1303   MCHC 33.2 10/26/2023 0903   RDW 13.7 10/26/2023 0903   RDW 13.0 04/08/2023 0830   LYMPHSABS 2.2 10/26/2023 0903   LYMPHSABS 2.2 04/08/2023 0830   MONOABS 0.8 10/26/2023 0903   EOSABS 0.2 10/26/2023 0903   EOSABS 0.2 04/08/2023 0830   BASOSABS 0.0 10/26/2023 0903   BASOSABS 0.0 04/08/2023 0830     Parts of this note may have been dictated using voice recognition software. There may be variances in spelling and vocabulary which are unintentional. Not all errors are proofread. Please notify the dino if any discrepancies are noted or if the meaning of any statement is not clear.

## 2023-12-31 NOTE — Telephone Encounter (Signed)
 Copied from CRM [id number]. Topic: Clinical - Medical Advice >> Dec 31, 2023  9:04 AM Deaijah H wrote: Reason for CRM: Patient called in stating he has little blisters on his arm and would like to send a picture for someone or his provider to look at. Per CAL was advised he can do mychart (possibly) or email patient chose email. Please contact [email or web address]

## 2023-12-31 NOTE — Progress Notes (Signed)
 Acute Office Visit  Subjective:     Patient ID: Earl Harvey, male    DOB: 1956-06-22, 68 y.o.   MRN: [id number]  Chief Complaint  Patient presents with  . Rash    Pt have rash on the left forearm, happened this week. Some itchiness to it and also now have some blisters. Some redness in the area     HPI Patient is in today with c/o a rash to the left forearm x 1 week.  Has a history of hyperlipidemia, hypothyroidism, type 2 diabetes and chronic kidney disease.  Patient reports noticing the rash after spending the night in the hospital with his daughter.  The rash was very itchy in the beginning and red.  However now has small pustules to the left forearm.  No pain.  No swelling.  Review of Systems  Skin:  Positive for itching and rash.       Left forearm  All other systems reviewed and are negative.  Past Medical History:  Diagnosis Date  . Cataract Dec.2024  . Diabetes mellitus   . Diverticulitis   . Gout   . Hypercholesteremia   . Hypertension   . Kidney disease   . Left foot pain 05/29/2022  . Rash 07/30/2022  . Thyroid  disease     Social History   Socioeconomic History  . Marital status: Divorced    Spouse name: Not on file  . Number of children: 1  . Years of education: 35  . Highest education level: 12th grade  Occupational History  . Not on file  Tobacco Use  . Smoking status: Former    Current packs/day: 0.00    Types: Cigarettes    Quit date: 05/07/1991    Years since quitting: 32.6  . Smokeless tobacco: Never  Vaping Use  . Vaping status: Never Used  Substance and Sexual Activity  . Alcohol use: Not Currently    Comment: Occasionally  . Drug use: No  . Sexual activity: Not Currently  Other Topics Concern  . Not on file  Social History Narrative  . Not on file   Social Drivers of Health   Financial Resource Strain: High Risk (12/31/2023)   Overall Financial Resource Strain (CARDIA)   . Difficulty of Paying Living Expenses: Hard  Food  Insecurity: No Food Insecurity (12/31/2023)   Hunger Vital Sign   . Worried About Programme researcher, broadcasting/film/video in the Last Year: Never true   . Ran Out of Food in the Last Year: Never true  Transportation Needs: No Transportation Needs (12/31/2023)   PRAPARE - Transportation   . Lack of Transportation (Medical): No   . Lack of Transportation (Non-Medical): No  Physical Activity: Sufficiently Active (12/31/2023)   Exercise Vital Sign   . Days of Exercise per Week: 4 days   . Minutes of Exercise per Session: 60 min  Stress: No Stress Concern Present (12/31/2023)   [employer] of Occupational Health - Occupational Stress Questionnaire   . Feeling of Stress: Not at all  Social Connections: Moderately Isolated (12/31/2023)   Social Connection and Isolation Panel   . Frequency of Communication with Friends and Family: More than three times a week   . Frequency of Social Gatherings with Friends and Family: Once a week   . Attends Religious Services: 1 to 4 times per year   . Active Member of Clubs or Organizations: No   . Attends Banker Meetings: Not on file   . Marital Status:  Divorced  Intimate Partner Violence: Not At Risk (02/11/2023)   Humiliation, Afraid, Rape, and Kick questionnaire   . Fear of Current or Ex-Partner: No   . Emotionally Abused: No   . Physically Abused: No   . Sexually Abused: No    Past Surgical History:  Procedure Laterality Date  . CERVICAL DISCECTOMY    . EYE SURGERY    . JOINT REPLACEMENT  1995    Family History  Problem Relation Age of Onset  . Hyperlipidemia Mother   . COPD Mother   . Hypertension Father   . Hyperlipidemia Father   . Arthritis Father   . Alcohol abuse Father   . Stroke Father   . Heart failure Father   . Heart disease Father   . Hypertension Sister   . Hyperlipidemia Sister   . Hypertension Brother   . Hyperlipidemia Brother   . Depression Brother   . Cancer Brother        lung  . Arthritis Brother   . Anxiety  disorder Brother   . Alcohol abuse Brother   . Epilepsy Daughter   . Cancer Brother     No Known Allergies  Current Outpatient Medications on File Prior to Visit  Medication Sig Dispense Refill  . allopurinol  (ZYLOPRIM ) 300 MG tablet Take 1 tablet (300 mg total) by mouth every other day. 30 tablet 4  . aspirin EC 81 MG tablet Take 81 mg by mouth daily. Swallow whole.    . Blood Glucose Monitoring Suppl DEVI Use to check blood sugars. May substitute to any manufacturer covered by patient's insurance. 1 each 0  . chlorthalidone  (HYGROTON ) 25 MG tablet Take 1 tablet (25 mg total) by mouth daily. 90 tablet 1  . Cholecalciferol (VITAMIN D3) 125 MCG (5000 UT) TABS Take 1 tablet by mouth daily.    . Continuous Glucose Sensor (DEXCOM G7 SENSOR) MISC 1 Device by Does not apply route continuous. 9 each 3  . dapagliflozin  propanediol (FARXIGA ) 5 MG TABS tablet Take 1 tablet (5 mg total) by mouth daily before breakfast. 30 tablet 3  . ezetimibe  (ZETIA ) 10 MG tablet Take 1 tablet by mouth once daily 90 tablet 0  . fish oil-omega-3 fatty acids 1000 MG capsule Take 2 g by mouth daily.      . Garlic 500 MG TABS Take 1 tablet by mouth daily.      . glimepiride  (AMARYL ) 2 MG tablet Take 1 tablet (2 mg total) by mouth 2 (two) times daily with a meal. 180 tablet 3  . Glucagon  (BAQSIMI  ONE PACK) 3 MG/DOSE POWD Place 1 Device into the nose as needed (Low blood sugar with impaired consciousness). 2 each 3  . Glucose Blood (BLOOD GLUCOSE TEST STRIPS) STRP Use to check blood sugars 2 times daily. May substitute to any manufacturer covered by patient's insurance. 200 strip 6  . hydrOXYzine  (ATARAX ) 25 MG tablet Take 1 tablet (25 mg total) by mouth every 6 (six) hours as needed for itching. 12 tablet 0  . ibuprofen  (ADVIL ,MOTRIN ) 800 MG tablet Take 1 tablet (800 mg total) by mouth 3 (three) times daily. 21 tablet 0  . insulin  regular human CONCENTRATED (HUMULIN  R U-500 KWIKPEN) 500 UNIT/ML KwikPen 75 units before  break fast, 55 units before lunch and 55 units before supper - 30 min before meals 39 mL 3  . Lancet Device MISC Use to check blood sugars. May substitute to any manufacturer covered by patient's insurance. 1 each 0  . Lancets Misc.  MISC Use to check blood sugars 2 times daily. May substitute to any manufacturer covered by patient's insurance. 200 each 6  . levocetirizine (XYZAL ) 5 MG tablet Take 1 tablet (5 mg total) by mouth every evening. 90 tablet 3  . levothyroxine  (SYNTHROID ) 125 MCG tablet Take 1 tablet (125 mcg total) by mouth every morning. 90 tablet 1  . losartan  (COZAAR ) 50 MG tablet Take 1 tablet by mouth once daily 90 tablet 0  . Multiple Vitamins-Minerals (MULTIVITAMINS THER. W/MINERALS) TABS Take 1 tablet by mouth daily.      . pravastatin  (PRAVACHOL ) 10 MG tablet Take 1 tablet (10 mg total) by mouth daily. 30 tablet 4  . triamcinolone  ointment (KENALOG ) 0.5 % Apply 1 Application topically 2 (two) times daily. 30 g 0  . Turmeric (QC TUMERIC COMPLEX PO) Take by mouth daily.     No current facility-administered medications on file prior to visit.    BP 108/74   Pulse 85   Temp 97.9 F (36.6 C) (Temporal)   Ht 5' 8 (1.727 m)   Wt 229 lb (103.9 kg)   SpO2 97%   BMI 34.82 kg/m chart     Objective:    BP 108/74   Pulse 85   Temp 97.9 F (36.6 C) (Temporal)   Ht 5' 8 (1.727 m)   Wt 229 lb (103.9 kg)   SpO2 97%   BMI 34.82 kg/m    Physical Exam Vitals reviewed.  Constitutional:      Appearance: Normal appearance. He is obese.   Cardiovascular:     Rate and Rhythm: Normal rate and regular rhythm.  Pulmonary:     Effort: Pulmonary effort is normal.     Breath sounds: Normal breath sounds.   Musculoskeletal:     Cervical back: Normal range of motion and neck supple.   Skin:    Findings: Erythema and rash present.     Comments: Pustules noted to the left forearm with mild erythema.  Nontender.  No excoriation.   Neurological:     General: No focal deficit  present.     Mental Status: He is alert and oriented to person, place, and time. Mental status is at baseline.   Psychiatric:        Mood and Affect: Mood normal.        Behavior: Behavior normal.   No results found for any visits on 12/31/23.      Assessment & Plan:   Problem List Items Addressed This Visit   None Visit Diagnoses       Folliculitis    -  Primary       Meds ordered this encounter  Medications  . cephALEXin (KEFLEX) 500 MG capsule    Sig: Take 1 capsule (500 mg total) by mouth 3 (three) times daily.    Dispense:  21 capsule    Refill:  0   Call the office if symptoms worsen or persist.  Recheck as scheduled and sooner as needed. No follow-ups on file.  Carra Brindley B Nikeisha Klutz, FNP

## 2023-12-31 NOTE — Patient Instructions (Signed)
 Will recommend the following: U500 INSULIN : 75 units before break fast, 70 units before lunch and 70 units before supper - 30 min before meals  Glimepiride  2 mg bid

## 2023-12-31 NOTE — Telephone Encounter (Signed)
 Per provider, will need to be seen in person for further assessment. Made appt for this afternoon at 1:15 pm to see another provider in office

## 2024-01-11 ENCOUNTER — Other Ambulatory Visit: Payer: Self-pay | Admitting: Family Medicine

## 2024-01-14 ENCOUNTER — Telehealth: Payer: Self-pay | Admitting: Family Medicine

## 2024-01-14 NOTE — Telephone Encounter (Signed)
 Copied from CRM [id number]. Topic: General - Other >> Jan 13, 2024  4:10 PM Melissa C wrote: Reason for CRM: [employer] called to see if patient documents were received through fax. I could not see any specific documents received for patient so I confirmed fax number with representative and let them know I would contact office. He will also re-fax document in case it didn't go through the first time.

## 2024-01-14 NOTE — Telephone Encounter (Signed)
 No forms received at this time

## 2024-01-17 ENCOUNTER — Encounter

## 2024-01-18 DIAGNOSIS — Z961 Presence of intraocular lens: Secondary | ICD-10-CM | POA: Diagnosis not present

## 2024-01-18 DIAGNOSIS — H26491 Other secondary cataract, right eye: Secondary | ICD-10-CM | POA: Diagnosis not present

## 2024-01-18 DIAGNOSIS — Z794 Long term (current) use of insulin: Secondary | ICD-10-CM | POA: Diagnosis not present

## 2024-01-18 DIAGNOSIS — E113491 Type 2 diabetes mellitus with severe nonproliferative diabetic retinopathy without macular edema, right eye: Secondary | ICD-10-CM | POA: Diagnosis not present

## 2024-01-18 DIAGNOSIS — E113592 Type 2 diabetes mellitus with proliferative diabetic retinopathy without macular edema, left eye: Secondary | ICD-10-CM | POA: Diagnosis not present

## 2024-01-18 DIAGNOSIS — H25812 Combined forms of age-related cataract, left eye: Secondary | ICD-10-CM | POA: Diagnosis not present

## 2024-01-18 DIAGNOSIS — H401132 Primary open-angle glaucoma, bilateral, moderate stage: Secondary | ICD-10-CM | POA: Diagnosis not present

## 2024-01-18 LAB — HM DIABETES EYE EXAM

## 2024-01-20 ENCOUNTER — Other Ambulatory Visit: Payer: Self-pay | Admitting: Vascular Surgery

## 2024-01-20 DIAGNOSIS — I825Y2 Chronic embolism and thrombosis of unspecified deep veins of left proximal lower extremity: Secondary | ICD-10-CM

## 2024-01-24 DIAGNOSIS — H2512 Age-related nuclear cataract, left eye: Secondary | ICD-10-CM | POA: Diagnosis not present

## 2024-01-25 ENCOUNTER — Ambulatory Visit: Admitting: Family Medicine

## 2024-01-31 ENCOUNTER — Telehealth: Payer: Self-pay | Admitting: Pharmacy Technician

## 2024-01-31 ENCOUNTER — Encounter: Admitting: Pediatrics

## 2024-01-31 NOTE — Progress Notes (Signed)
   01/31/2024 Name: SAMIR ISHAQ MRN: [id number] DOB: June 25, 1956  Patient is appearing on a report for [facility] [place] [facility] and last engaged with the clinical pharmacist to discuss diabetes on 11/23/2023. Contacted patient today to discuss diabetes management and completed medication review.   Diabetes Plan from last clinical pharmacist appointment:  Diabetes: - Currently uncontrolled, BG goal <7.5% - Reviewed long term cardiovascular and renal outcomes of uncontrolled blood sugar - Reviewed goal A1c, goal fasting, and goal 2 hour post prandial glucose - Pt came in office for help applying the Dexcom G7 sensor and get it connected to his monitor. - Pt unwilling to take GLP-1 agonist. States he does not take any medications with BBW - have explained to pt that GLP-1 agonist will be the best option to help with insulin  resistance and explained the details of the black box warning (risk of thyroid  cancer seen in mice in studies, none seen in humans, however use is contraindicated for people with personal or family hx of specific type of thyroid  cancer.) - Reviewed dietary modifications - Endo f/u 12/31/23  -Follow Up Plan: PRN    Medication Adherence Barriers Identified:  Patient made recommended medication changes per plan: Yes Patient saw Endo on 12/31/23 . Patient informs he takes  Humulin  R u500 at 75 units before breakfast, and 70 units before lunch AND before supper. He also informs he takes Farxiga  5mg  daily and Glimepiride  2mg  twice a day. He reports adherence and no cost concerns. Access issues with any new medication or testing device: No Per Dr Annemarie, patient last received 30 day supply of Farxiga  on 01/29/24, Humulin  R u500 on 01/25/24 for 97 day supply, and 90 days supply of Glimepiride  on 11/15/23.  Patient is checking blood sugars as prescribed: Yes Patient informs he uses Dexcom G7 to check his blood sugars. He informs his blood sugar is averaging around 150-160. He informs his  blood sugar sometimes goes over 200 if he eats a big meal but it doesn't stay high that long. He also informs it will drop below 100 if he has not eaten enough during the day. Patient informs he has a plan in place if it gets low as he has been dealing  with for diabetes years. Patient informs he is having upcoming eye surgery next week.  Medication Adherence Barriers Addressed/Actions Taken:  Reviewed medication changes per plan from last clinical pharmacist note Educated patient to contact pharmacy regarding new prescriptions/refills.  Reviewed instructions for monitoring blood sugars at home and reminded patient to keep a written log to review with pharmacist Reminded patient of date/time of upcoming clinical pharmacist follow up and any upcoming PCP/specialists visits. Patient denies transportation barriers to the appointment. Yes Shaquasia Caponigro, CPhT [facility] Office: [phone] Email: [email or web address]   Next clinical pharmacist appointment is scheduled for: TBD

## 2024-02-01 ENCOUNTER — Ambulatory Visit (INDEPENDENT_AMBULATORY_CARE_PROVIDER_SITE_OTHER)

## 2024-02-01 VITALS — Ht 68.0 in | Wt 229.0 lb

## 2024-02-01 DIAGNOSIS — Z0000 Encounter for general adult medical examination without abnormal findings: Secondary | ICD-10-CM | POA: Diagnosis not present

## 2024-02-01 DIAGNOSIS — E1165 Type 2 diabetes mellitus with hyperglycemia: Secondary | ICD-10-CM | POA: Diagnosis not present

## 2024-02-01 NOTE — Progress Notes (Signed)
 Subjective:   Earl Harvey is a 68 y.o. who presents for a Medicare Wellness preventive visit.  As a reminder, Annual Wellness Visits don't include a physical exam, and some assessments may be limited, especially if this visit is performed virtually. We may recommend an in-person follow-up visit with your provider if needed.  Visit Complete: Virtual I connected with  Murrel M Rigsby on 02/01/24 by a audio enabled telemedicine application and verified that I am speaking with the correct person using two identifiers.  Patient Location: Home  Provider Location: Home Office  I discussed the limitations of evaluation and management by telemedicine. The patient expressed understanding and agreed to proceed.  Vital Signs: Because this visit was a virtual/telehealth visit, some criteria may be missing or patient reported. Any vitals not documented were not able to be obtained and vitals that have been documented are patient reported.  VideoDeclined- This patient declined Librarian, academic. Therefore the visit was completed with audio only.  Persons Participating in Visit: Patient.  AWV Questionnaire: Yes: Patient Medicare AWV questionnaire was completed by the patient on 01/29/2024; I have confirmed that all information answered by patient is correct and no changes since this date.  Cardiac Risk Factors include: advanced age (>12men, >26 women);hypertension;male gender;dyslipidemia;Other (see comment);diabetes mellitus;obesity (BMI >30kg/m2), Risk factor comments: CKD stage 3a     Objective:    Today's Vitals   02/01/24 1030  Weight: 229 lb (103.9 kg)  Height: 5' 8 (1.727 m)   Body mass index is 34.82 kg/m.     02/01/2024   10:47 AM 04/16/2023   12:03 PM 02/11/2023   10:05 AM 01/04/2022   11:51 AM  Advanced Directives  Does Patient Have a Medical Advance Directive? No No No No  Would patient like information on creating a medical advance directive?  No -  Patient declined No - Patient declined     Current Medications (verified) Outpatient Encounter Medications as of 02/01/2024  Medication Sig   allopurinol  (ZYLOPRIM ) 300 MG tablet Take 1 tablet (300 mg total) by mouth every other day.   aspirin EC 81 MG tablet Take 81 mg by mouth daily. Swallow whole.   Blood Glucose Monitoring Suppl DEVI Use to check blood sugars. May substitute to any manufacturer covered by patient's insurance.   cephALEXin  (KEFLEX ) 500 MG capsule Take 1 capsule (500 mg total) by mouth 3 (three) times daily.   chlorthalidone  (HYGROTON ) 25 MG tablet Take 1 tablet (25 mg total) by mouth daily.   Cholecalciferol (VITAMIN D3) 125 MCG (5000 UT) TABS Take 1 tablet by mouth daily.   Continuous Glucose Sensor (DEXCOM G7 SENSOR) MISC 1 Device by Does not apply route continuous.   dapagliflozin  propanediol (FARXIGA ) 5 MG TABS tablet Take 1 tablet (5 mg total) by mouth daily before breakfast.   ezetimibe  (ZETIA ) 10 MG tablet Take 1 tablet by mouth once daily   fish oil-omega-3 fatty acids 1000 MG capsule Take 2 g by mouth daily.     Garlic 500 MG TABS Take 1 tablet by mouth daily.     glimepiride  (AMARYL ) 2 MG tablet Take 1 tablet (2 mg total) by mouth 2 (two) times daily with a meal.   Glucagon  (BAQSIMI  ONE PACK) 3 MG/DOSE POWD Place 1 Device into the nose as needed (Low blood sugar with impaired consciousness).   Glucose Blood (BLOOD GLUCOSE TEST STRIPS) STRP Use to check blood sugars 2 times daily. May substitute to any manufacturer covered by patient's insurance.  hydrOXYzine  (ATARAX ) 25 MG tablet Take 1 tablet (25 mg total) by mouth every 6 (six) hours as needed for itching.   ibuprofen  (ADVIL ,MOTRIN ) 800 MG tablet Take 1 tablet (800 mg total) by mouth 3 (three) times daily.   insulin  regular human CONCENTRATED (HUMULIN  R U-500 KWIKPEN) 500 UNIT/ML KwikPen 75 units before break fast, 55 units before lunch and 55 units before supper - 30 min before meals   Lancet Device MISC Use to  check blood sugars. May substitute to any manufacturer covered by patient's insurance.   Lancets Misc. MISC Use to check blood sugars 2 times daily. May substitute to any manufacturer covered by patient's insurance.   levocetirizine (XYZAL ) 5 MG tablet Take 1 tablet (5 mg total) by mouth every evening.   levothyroxine  (SYNTHROID ) 125 MCG tablet Take 1 tablet (125 mcg total) by mouth every morning.   losartan  (COZAAR ) 50 MG tablet Take 1 tablet by mouth once daily   Multiple Vitamins-Minerals (MULTIVITAMINS THER. W/MINERALS) TABS Take 1 tablet by mouth daily.     pravastatin  (PRAVACHOL ) 10 MG tablet Take 1 tablet (10 mg total) by mouth daily.   triamcinolone  ointment (KENALOG ) 0.5 % Apply 1 Application topically 2 (two) times daily.   Turmeric (QC TUMERIC COMPLEX PO) Take by mouth daily.   No facility-administered encounter medications on file as of 02/01/2024.    Allergies (verified) Patient has no known allergies.   History: Past Medical History:  Diagnosis Date   Cataract Dec.2024   Diabetes mellitus    Diverticulitis    Gout    Hypercholesteremia    Hypertension    Kidney disease    Left foot pain 05/29/2022   Rash 07/30/2022   Thyroid  disease    Past Surgical History:  Procedure Laterality Date   CERVICAL DISCECTOMY     EYE SURGERY     JOINT REPLACEMENT  1995   Family History  Problem Relation Age of Onset   Hyperlipidemia Mother    COPD Mother    Hypertension Father    Hyperlipidemia Father    Arthritis Father    Alcohol abuse Father    Stroke Father    Heart failure Father    Heart disease Father    Hypertension Sister    Hyperlipidemia Sister    Hypertension Brother    Hyperlipidemia Brother    Depression Brother    Cancer Brother        lung   Arthritis Brother    Anxiety disorder Brother    Alcohol abuse Brother    Epilepsy Daughter    Cancer Brother    Social History   Socioeconomic History   Marital status: Divorced    Spouse name: Not on file    Number of children: 1   Years of education: 13   Highest education level: 12th grade  Occupational History   Occupation: RETIRED  Tobacco Use   Smoking status: Former    Current packs/day: 0.00    Types: Cigarettes    Quit date: 05/07/1991    Years since quitting: 32.7   Smokeless tobacco: Never  Vaping Use   Vaping status: Never Used  Substance and Sexual Activity   Alcohol use: Not Currently    Comment: Occasionally   Drug use: No   Sexual activity: Not Currently  Other Topics Concern   Not on file  Social History Narrative   Has a roommate/2025   Social Drivers of Health   Financial Resource Strain: High Risk (02/01/2024)   Overall  Financial Resource Strain (CARDIA)    Difficulty of Paying Living Expenses: Hard  Food Insecurity: No Food Insecurity (02/01/2024)   Hunger Vital Sign    Worried About Running Out of Food in the Last Year: Never true    Ran Out of Food in the Last Year: Never true  Transportation Needs: No Transportation Needs (02/01/2024)   PRAPARE - Administrator, Civil Service (Medical): No    Lack of Transportation (Non-Medical): No  Physical Activity: Sufficiently Active (02/01/2024)   Exercise Vital Sign    Days of Exercise per Week: 4 days    Minutes of Exercise per Session: 40 min  Stress: No Stress Concern Present (02/01/2024)   [employer] of Occupational Health - Occupational Stress Questionnaire    Feeling of Stress: Not at all  Social Connections: Moderately Isolated (02/01/2024)   Social Connection and Isolation Panel    Frequency of Communication with Friends and Family: More than three times a week    Frequency of Social Gatherings with Friends and Family: Once a week    Attends Religious Services: 1 to 4 times per year    Active Member of [employer] or Organizations: No    Attends Engineer, structural: Not on file    Marital Status: Divorced    Tobacco Counseling Counseling given: Not Answered    Clinical  Intake:  Pre-visit preparation completed: Yes  Pain : No/denies pain     BMI - recorded: 34.82 Nutritional Status: BMI > 30  Obese Nutritional Risks: None Diabetes: Yes CBG done?: Yes (Dexacom 7 device/) CBG resulted in Enter/ Edit results?: No Did pt. bring in CBG monitor from home?: No  Lab Results  Component Value Date   HGBA1C 8.8 (A) 11/18/2023   HGBA1C 9.2 (H) 10/26/2023   HGBA1C 9.0 (H) 07/28/2023     How often do you need to have someone help you when you read instructions, pamphlets, or other written materials from your doctor or pharmacy?: 1 - Never  Interpreter Needed?: No  Information entered by :: Yasmen Cortner, RMA   Activities of Daily Living     01/29/2024    9:13 AM 02/11/2023    9:53 AM  In your present state of health, do you have any difficulty performing the following activities:  Hearing? 0 0  Vision? 0 0  Difficulty concentrating or making decisions? 0 0  Walking or climbing stairs? 0 0  Dressing or bathing? 0 0  Doing errands, shopping? 0 0  Preparing Food and eating ? N N  Using the Toilet? N N  In the past six months, have you accidently leaked urine? N Y  Do you have problems with loss of bowel control? N N  Managing your Medications? N N  Managing your Finances? N N  Housekeeping or managing your Housekeeping? N N    Patient Care Team: Lendia Boby CROME, NP-C as PCP - General (Family Medicine) Merceda Lela SAUNDERS, [facility] (Pharmacist)  I have updated your Care Teams any recent Medical Services you may have received from other providers in the past year.     Assessment:   This is a routine wellness examination for Earl Harvey.  Hearing/Vision screen Hearing Screening - Comments:: Denies hearing difficulties   Vision Screening - Comments:: Wears eyeglasses/Dr. Octavia   Goals Addressed   None    Depression Screen     02/01/2024   10:49 AM 10/26/2023    8:04 AM 04/08/2023    8:31 AM 02/11/2023  10:02 AM 01/04/2023    8:50 AM 10/01/2022     3:11 PM  PHQ 2/9 Scores  PHQ - 2 Score 3 0 0 0 0 0  PHQ- 9 Score 3  0 0 0     Fall Risk     01/29/2024    9:13 AM 10/26/2023    8:04 AM 04/08/2023    8:30 AM 01/04/2023    8:50 AM 10/01/2022    3:10 PM  Fall Risk   Falls in the past year? 0 0 0 0 0  Number falls in past yr: 0 0  0   Injury with Fall? 0 0  0   Risk for fall due to :  No Fall Risks  No Fall Risks   Follow up Falls evaluation completed;Falls prevention discussed Falls evaluation completed  Education provided     MEDICARE RISK AT HOME:  Medicare Risk at Home Any stairs in or around the home?: (Patient-Rptd) Yes If so, are there any without handrails?: (Patient-Rptd) Yes Home free of loose throw rugs in walkways, pet beds, electrical cords, etc?: (Patient-Rptd) Yes Adequate lighting in your home to reduce risk of falls?: (Patient-Rptd) Yes Life alert?: (Patient-Rptd) No Use of a cane, walker or w/c?: (Patient-Rptd) No Grab bars in the bathroom?: (Patient-Rptd) No Shower chair or bench in shower?: (Patient-Rptd) Yes Elevated toilet seat or a handicapped toilet?: (Patient-Rptd) No  TIMED UP AND GO:  Was the test performed?  No  Cognitive Function: Declined/Normal: No cognitive concerns noted by patient or family. Patient alert, oriented, able to answer questions appropriately and recall recent events. No signs of memory loss or confusion.    02/11/2023   10:08 AM  MMSE - Mini Mental State Exam  Orientation to time 5  Orientation to Place 5  Registration 3  Attention/ Calculation 3  Recall 2  Language- name 2 objects 2  Language- repeat 1  Language- follow 3 step command 3  Language- read & follow direction 1  Write a sentence 1  Copy design 1  Total score 27        Immunizations Immunization History  Administered Date(s) Administered   Influenza Split 03/21/2015   Influenza Whole 03/28/2008   Influenza, High Dose Seasonal PF 03/06/2014, 04/05/2015   Influenza,trivalent, recombinat, inj, PF 04/21/2012    Pneumococcal Polysaccharide-23 04/21/2012, 06/14/2013   Tdap 04/21/2012, 02/11/2023    Screening Tests Health Maintenance  Topic Date Due   Zoster Vaccines- Shingrix (1 of 2) Never done   Pneumococcal Vaccine: 50+ Years (3 of 3 - PCV) 06/14/2014   OPHTHALMOLOGY EXAM  07/21/2023   Diabetic kidney evaluation - Urine ACR  10/01/2023   Colonoscopy  01/30/2024   Medicare Annual Wellness (AWV)  02/11/2024   COVID-19 Vaccine (1 - 2024-25 season) 04/23/2024 (Originally 03/07/2023)   INFLUENZA VACCINE  02/04/2024   HEMOGLOBIN A1C  05/20/2024   Diabetic kidney evaluation - eGFR measurement  10/25/2024   FOOT EXAM  10/25/2024   DTaP/Tdap/Td (3 - Td or Tdap) 02/10/2033   Hepatitis C Screening  Completed   Hepatitis B Vaccines  Aged Out   HPV VACCINES  Aged Out   Meningococcal B Vaccine  Aged Out    Health Maintenance  Health Maintenance Due  Topic Date Due   Zoster Vaccines- Shingrix (1 of 2) Never done   Pneumococcal Vaccine: 50+ Years (3 of 3 - PCV) 06/14/2014   OPHTHALMOLOGY EXAM  07/21/2023   Diabetic kidney evaluation - Urine ACR  10/01/2023   Colonoscopy  01/30/2024   Medicare Annual Wellness (AWV)  02/11/2024   Health Maintenance Items Addressed: Referral sent to GI for colonoscopy, Labs Ordered: UACR, See Nurse Notes at the end of this note  Additional Screening:  Vision Screening: Recommended annual ophthalmology exams for early detection of glaucoma and other disorders of the eye. Would you like a referral to an eye doctor? No    Dental Screening: Recommended annual dental exams for proper oral hygiene  Community Resource Referral / Chronic Care Management: CRR required this visit?  No   CCM required this visit?  No   Plan:    I have personally reviewed and noted the following in the patient's chart:   Medical and social history Use of alcohol, tobacco or illicit drugs  Current medications and supplements including opioid prescriptions. Patient is not  currently taking opioid prescriptions. Functional ability and status Nutritional status Physical activity Advanced directives List of other physicians Hospitalizations, surgeries, and ER visits in previous 12 months Vitals Screenings to include cognitive, depression, and falls Referrals and appointments  In addition, I have reviewed and discussed with patient certain preventive protocols, quality metrics, and best practice recommendations. A written personalized care plan for preventive services as well as general preventive health recommendations were provided to patient.   Quinnley Colasurdo L Wlliam Grosso, CMA   02/01/2024   After Visit Summary: (MyChart) Due to this being a telephonic visit, the after visit summary with patients personalized plan was offered to patient via MyChart   Notes: Patient is due for a colonoscopy and referral has been placed.  Patient stated that he had to reschedule the appointment for a later time.  He stated that he has had his diabetic eye exam recently.  I have sent a request for his record out today.   Patient is due for a UACR and order has been placed today.  Patient declines any due vaccines for now, (Shingrix and Pneumonia).  He had no other concerns to address today.

## 2024-02-01 NOTE — Patient Instructions (Signed)
 Mr. Ardolino , Thank you for taking time out of your busy schedule to complete your Annual Wellness Visit with me. I enjoyed our conversation and look forward to speaking with you again next year. I, as well as your care team,  appreciate your ongoing commitment to your health goals. Please review the following plan we discussed and let me know if I can assist you in the future. Your Game plan/ To Do List    Follow up Visits: Next Medicare AWV with our clinical staff: 02/01/2025.   Have you seen your provider in the last 6 months (3 months if uncontrolled diabetes)? Yes Next Office Visit with your provider: Patient stated that he will call the office to get scheduled for his next visit.  Last visit was on 12/31/2023.  Clinician Recommendations:  Aim for 30 minutes of exercise or brisk walking, 6-8 glasses of water, and 5 servings of fruits and vegetables each day. Remember to call and get scheduled for your colonoscopy soon.  You are due for a diabetic kidney evaluation and will have that done during your next office visit here.        This is a list of the screening recommended for you and due dates:  Health Maintenance  Topic Date Due   Zoster (Shingles) Vaccine (1 of 2) Never done   Pneumococcal Vaccine for age over 20 (3 of 3 - PCV) 06/14/2014   Eye exam for diabetics  07/21/2023   Yearly kidney health urinalysis for diabetes  10/01/2023   Colon Cancer Screening  01/30/2024   Medicare Annual Wellness Visit  02/11/2024   COVID-19 Vaccine (1 - 2024-25 season) 04/23/2024*   Flu Shot  02/04/2024   Hemoglobin A1C  05/20/2024   Yearly kidney function blood test for diabetes  10/25/2024   Complete foot exam   10/25/2024   DTaP/Tdap/Td vaccine (3 - Td or Tdap) 02/10/2033   Hepatitis C Screening  Completed   Hepatitis B Vaccine  Aged Out   HPV Vaccine  Aged Out   Meningitis B Vaccine  Aged Out  *Topic was postponed. The date shown is not the original due date.    Advanced directives:  (Declined) Advance directive discussed with you today. Even though you declined this today, please call our office should you change your mind, and we can give you the proper paperwork for you to fill out. Advance Care Planning is important because it:  [x]  Makes sure you receive the medical care that is consistent with your values, goals, and preferences  [x]  It provides guidance to your family and loved ones and reduces their decisional burden about whether or not they are making the right decisions based on your wishes.  Follow the link provided in your after visit summary or read over the paperwork we have mailed to you to help you started getting your Advance Directives in place. If you need assistance in completing these, please reach out to us  so that we can help you!  See attachments for Preventive Care and Fall Prevention Tips.

## 2024-02-03 NOTE — Progress Notes (Unsigned)
 Office Note     CC:  follow up Requesting Provider:  Lendia Boby CROME, NP-C  HPI: Earl Harvey is a 68 y.o. (03-19-1956) male who presents for surveillance of left lower extremity.  He sustained a provoked DVT of the left lower extremity in May 2023 involving the femoral vein.  He took Eliquis  for over a year.  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  Dr. Eliza felt it would be reasonable for discontinuation of Eliquis  due to risks of anticoagulation long-term.  He returns today for repeat left lower extremity venous duplex.  He denies any left lower extremity edema.  He does not have any venous ulcerations.  He does not wear compression.  He walks for exercise.  He denies tobacco use.   Past Medical History:  Diagnosis Date   Cataract Dec.2024   Diabetes mellitus    Diverticulitis    Gout    Hypercholesteremia    Hypertension    Kidney disease    Left foot pain 05/29/2022   Rash 07/30/2022   Thyroid  disease     Past Surgical History:  Procedure Laterality Date   CERVICAL DISCECTOMY     EYE SURGERY     JOINT REPLACEMENT  1995    Social History   Socioeconomic History   Marital status: Divorced    Spouse name: Not on file   Number of children: 1   Years of education: 13   Highest education level: 12th grade  Occupational History   Occupation: RETIRED  Tobacco Use   Smoking status: Former    Current packs/day: 0.00    Types: Cigarettes    Quit date: 05/07/1991    Years since quitting: 32.7   Smokeless tobacco: Never  Vaping Use   Vaping status: Never Used  Substance and Sexual Activity   Alcohol use: Not Currently    Comment: Occasionally   Drug use: No   Sexual activity: Not Currently  Other Topics Concern   Not on file  Social History Narrative   Has a roommate/2025   Social Drivers of Health   Financial Resource Strain: High Risk (02/01/2024)   Overall Financial Resource Strain  (CARDIA)    Difficulty of Paying Living Expenses: Hard  Food Insecurity: No Food Insecurity (02/01/2024)   Hunger Vital Sign    Worried About Running Out of Food in the Last Year: Never true    Ran Out of Food in the Last Year: Never true  Transportation Needs: No Transportation Needs (02/01/2024)   PRAPARE - Administrator, Civil Service (Medical): No    Lack of Transportation (Non-Medical): No  Physical Activity: Sufficiently Active (02/01/2024)   Exercise Vital Sign    Days of Exercise per Week: 4 days    Minutes of Exercise per Session: 40 min  Stress: No Stress Concern Present (02/01/2024)   [employer] of Occupational Health - Occupational Stress Questionnaire    Feeling of Stress: Not at all  Social Connections: Moderately Isolated (02/01/2024)   Social Connection and Isolation Panel    Frequency of Communication with Friends and Family: More than three times a week    Frequency of Social Gatherings with Friends and Family: Once a week    Attends Religious Services: 1 to 4 times per year    Active Member of [employer] or Organizations: No    Attends Engineer, structural: Not on file    Marital Status: Divorced  Catering manager  Violence: Patient Unable To Answer (02/01/2024)   Humiliation, Afraid, Rape, and Kick questionnaire    Fear of Current or Ex-Partner: Patient unable to answer    Emotionally Abused: Patient unable to answer    Physically Abused: Patient unable to answer    Sexually Abused: Patient unable to answer    Family History  Problem Relation Age of Onset   Hyperlipidemia Mother    COPD Mother    Hypertension Father    Hyperlipidemia Father    Arthritis Father    Alcohol abuse Father    Stroke Father    Heart failure Father    Heart disease Father    Hypertension Sister    Hyperlipidemia Sister    Hypertension Brother    Hyperlipidemia Brother    Depression Brother    Cancer Brother        lung   Arthritis Brother    Anxiety  disorder Brother    Alcohol abuse Brother    Epilepsy Daughter    Cancer Brother     Current Outpatient Medications  Medication Sig Dispense Refill   allopurinol  (ZYLOPRIM ) 300 MG tablet Take 1 tablet (300 mg total) by mouth every other day. 30 tablet 4   aspirin EC 81 MG tablet Take 81 mg by mouth daily. Swallow whole.     Blood Glucose Monitoring Suppl DEVI Use to check blood sugars. May substitute to any manufacturer covered by patient's insurance. 1 each 0   cephALEXin  (KEFLEX ) 500 MG capsule Take 1 capsule (500 mg total) by mouth 3 (three) times daily. 21 capsule 0   chlorthalidone  (HYGROTON ) 25 MG tablet Take 1 tablet (25 mg total) by mouth daily. 90 tablet 1   Cholecalciferol (VITAMIN D3) 125 MCG (5000 UT) TABS Take 1 tablet by mouth daily.     Continuous Glucose Sensor (DEXCOM G7 SENSOR) MISC 1 Device by Does not apply route continuous. 9 each 3   dapagliflozin  propanediol (FARXIGA ) 5 MG TABS tablet Take 1 tablet (5 mg total) by mouth daily before breakfast. 30 tablet 3   ezetimibe  (ZETIA ) 10 MG tablet Take 1 tablet by mouth once daily 90 tablet 0   fish oil-omega-3 fatty acids 1000 MG capsule Take 2 g by mouth daily.       Garlic 500 MG TABS Take 1 tablet by mouth daily.       glimepiride  (AMARYL ) 2 MG tablet Take 1 tablet (2 mg total) by mouth 2 (two) times daily with a meal. 180 tablet 3   Glucagon  (BAQSIMI  ONE PACK) 3 MG/DOSE POWD Place 1 Device into the nose as needed (Low blood sugar with impaired consciousness). 2 each 3   Glucose Blood (BLOOD GLUCOSE TEST STRIPS) STRP Use to check blood sugars 2 times daily. May substitute to any manufacturer covered by patient's insurance. 200 strip 6   hydrOXYzine  (ATARAX ) 25 MG tablet Take 1 tablet (25 mg total) by mouth every 6 (six) hours as needed for itching. 12 tablet 0   ibuprofen  (ADVIL ,MOTRIN ) 800 MG tablet Take 1 tablet (800 mg total) by mouth 3 (three) times daily. 21 tablet 0   insulin  regular human CONCENTRATED (HUMULIN  R U-500  KWIKPEN) 500 UNIT/ML KwikPen 75 units before break fast, 55 units before lunch and 55 units before supper - 30 min before meals 39 mL 3   Lancet Device MISC Use to check blood sugars. May substitute to any manufacturer covered by patient's insurance. 1 each 0   Lancets Misc. MISC Use to check blood sugars  2 times daily. May substitute to any manufacturer covered by patient's insurance. 200 each 6   levocetirizine (XYZAL ) 5 MG tablet Take 1 tablet (5 mg total) by mouth every evening. 90 tablet 3   levothyroxine  (SYNTHROID ) 125 MCG tablet Take 1 tablet (125 mcg total) by mouth every morning. 90 tablet 1   losartan  (COZAAR ) 50 MG tablet Take 1 tablet by mouth once daily 90 tablet 0   Multiple Vitamins-Minerals (MULTIVITAMINS THER. W/MINERALS) TABS Take 1 tablet by mouth daily.       pravastatin  (PRAVACHOL ) 10 MG tablet Take 1 tablet (10 mg total) by mouth daily. (Patient not taking: Reported on 02/04/2024) 30 tablet 4   triamcinolone  ointment (KENALOG ) 0.5 % Apply 1 Application topically 2 (two) times daily. 30 g 0   Turmeric (QC TUMERIC COMPLEX PO) Take by mouth daily.     No current facility-administered medications for this visit.    No Known Allergies   REVIEW OF SYSTEMS:   [X]  denotes positive finding, [ ]  denotes negative finding Cardiac  Comments:  Chest pain or chest pressure:    Shortness of breath upon exertion:    Short of breath when lying flat:    Irregular heart rhythm:        Vascular    Pain in calf, thigh, or hip brought on by ambulation:    Pain in feet at night that wakes you up from your sleep:     Blood clot in your veins:    Leg swelling:         Pulmonary    Oxygen at home:    Productive cough:     Wheezing:         Neurologic    Sudden weakness in arms or legs:     Sudden numbness in arms or legs:     Sudden onset of difficulty speaking or slurred speech:    Temporary loss of vision in one eye:     Problems with dizziness:         Gastrointestinal     Blood in stool:     Vomited blood:         Genitourinary    Burning when urinating:     Blood in urine:        Psychiatric    Major depression:         Hematologic    Bleeding problems:    Problems with blood clotting too easily:        Skin    Rashes or ulcers:        Constitutional    Fever or chills:      PHYSICAL EXAMINATION:  Vitals:   02/04/24 1437  BP: 125/73  Pulse: 78  Temp: 98.2 F (36.8 C)  TempSrc: Temporal  Weight: 233 lb 11.2 oz (106 kg)    General:  WDWN in NAD; vital signs documented above Gait: Not observed HENT: WNL, normocephalic Pulmonary: normal non-labored breathing Cardiac: regular HR Abdomen: soft, NT, no masses Skin: without rashes Vascular Exam/Pulses: Palpable DP pulses Extremities: without ischemic changes, without Gangrene , without cellulitis; without open wounds;  Musculoskeletal: no muscle wasting or atrophy  Neurologic: A&O X 3 Psychiatric:  The pt has Normal affect.   Non-Invasive Vascular Imaging:   Venous duplex demonstrates chronic appearing thrombus in the femoral vein popliteal vein and peroneal vein LLE    ASSESSMENT/PLAN:: 68 y.o. male here for follow up related to history of left leg DVT  Since last office visit  he denies any pain or swelling in his left lower extremity.  He is walking for exercise.  He does not wear compression however does not feel like he needs to given lack of symptoms.  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  I recommended against any further imaging unless there is a change in exam.  He can follow-up with us  on as-needed basis.   Donnice Sender, PA-C [facility] [phone]  Clinic MD:   [place]

## 2024-02-04 ENCOUNTER — Ambulatory Visit (HOSPITAL_COMMUNITY)
Admission: RE | Admit: 2024-02-04 | Discharge: 2024-02-04 | Disposition: A | Discharge status: Home / Self Care | Source: Ambulatory Visit | Attending: Vascular Surgery | Admitting: Vascular Surgery

## 2024-02-04 ENCOUNTER — Ambulatory Visit (INDEPENDENT_AMBULATORY_CARE_PROVIDER_SITE_OTHER): Admitting: Physician Assistant

## 2024-02-04 VITALS — BP 125/73 | HR 78 | Temp 98.2°F | Wt 233.7 lb

## 2024-02-04 DIAGNOSIS — I825Y2 Chronic embolism and thrombosis of unspecified deep veins of left proximal lower extremity: Secondary | ICD-10-CM

## 2024-02-06 ENCOUNTER — Other Ambulatory Visit: Payer: Self-pay | Admitting: Family Medicine

## 2024-02-06 DIAGNOSIS — E1165 Type 2 diabetes mellitus with hyperglycemia: Secondary | ICD-10-CM

## 2024-02-08 ENCOUNTER — Other Ambulatory Visit: Payer: Self-pay | Admitting: Family Medicine

## 2024-02-08 DIAGNOSIS — E1165 Type 2 diabetes mellitus with hyperglycemia: Secondary | ICD-10-CM

## 2024-02-08 NOTE — Telephone Encounter (Unsigned)
 Copied from CRM #[id number]. Topic: Clinical - Medication Refill >> Feb 08, 2024  2:43 PM Franky GRADE wrote: Medication: glimepiride  (AMARYL ) 2 MG tablet [[id number]]  Has the patient contacted their pharmacy? Yes (Agent: If no, request that the patient contact the pharmacy for the refill. If patient does not wish to contact the pharmacy document the reason why and proceed with request.) (Agent: If yes, when and what did the pharmacy advise?)  This is the patient's preferred pharmacy:  [facility] [address] ([address] [place] - [address] [place] ([place]) [place] [address] Phone: [phone] Fax: [phone]  Is this the correct pharmacy for this prescription? Yes If no, delete pharmacy and type the correct one.   Has the prescription been filled recently? No  Is the patient out of the medication? No  Has the patient been seen for an appointment in the last year OR does the patient have an upcoming appointment? Yes  Can we respond through MyChart? Yes  Agent: Please be advised that Rx refills may take up to 3 business days. We ask that you follow-up with your pharmacy.

## 2024-02-09 ENCOUNTER — Telehealth: Payer: Self-pay | Admitting: *Deleted

## 2024-02-09 NOTE — Progress Notes (Signed)
 Complex Care Management Care Guide Note  02/09/2024 Name: ARN MCOMBER MRN: [id number] DOB: 03-03-1956  Esmeralda CHRISTELLA Scherzer is a 68 y.o. year old male who is a primary care patient of Lendia Boby CROME, NP-C and is actively engaged with the care management team. I reached out to [facility] by phone today to assist with scheduling  with the Pharmacist.  Follow up plan: pt declined to schedule f/u with pharmacist   Thedford Franks, CMA [facility]  [facility], [facility] Guide Direct Dial: [phone]  Fax: [phone] Website: [email or web address]

## 2024-02-10 DIAGNOSIS — H2512 Age-related nuclear cataract, left eye: Secondary | ICD-10-CM | POA: Diagnosis not present

## 2024-02-10 DIAGNOSIS — H401122 Primary open-angle glaucoma, left eye, moderate stage: Secondary | ICD-10-CM | POA: Diagnosis not present

## 2024-02-10 MED ORDER — GLIMEPIRIDE 2 MG PO TABS: 2.0000 mg | ORAL_TABLET | Freq: Two times a day (BID) | ORAL | 3 refills | Status: DC

## 2024-02-17 ENCOUNTER — Encounter (INDEPENDENT_AMBULATORY_CARE_PROVIDER_SITE_OTHER): Admitting: Ophthalmology

## 2024-02-17 DIAGNOSIS — Z794 Long term (current) use of insulin: Secondary | ICD-10-CM | POA: Diagnosis not present

## 2024-02-17 DIAGNOSIS — I10 Essential (primary) hypertension: Secondary | ICD-10-CM | POA: Diagnosis not present

## 2024-02-17 DIAGNOSIS — Z7984 Long term (current) use of oral hypoglycemic drugs: Secondary | ICD-10-CM

## 2024-02-17 DIAGNOSIS — H35033 Hypertensive retinopathy, bilateral: Secondary | ICD-10-CM | POA: Diagnosis not present

## 2024-02-17 DIAGNOSIS — E113512 Type 2 diabetes mellitus with proliferative diabetic retinopathy with macular edema, left eye: Secondary | ICD-10-CM | POA: Diagnosis not present

## 2024-02-17 DIAGNOSIS — E113391 Type 2 diabetes mellitus with moderate nonproliferative diabetic retinopathy without macular edema, right eye: Secondary | ICD-10-CM

## 2024-02-17 DIAGNOSIS — H43813 Vitreous degeneration, bilateral: Secondary | ICD-10-CM

## 2024-02-18 ENCOUNTER — Encounter: Payer: Self-pay | Admitting: Family Medicine

## 2024-02-18 ENCOUNTER — Ambulatory Visit (INDEPENDENT_AMBULATORY_CARE_PROVIDER_SITE_OTHER): Admitting: Family Medicine

## 2024-02-18 VITALS — BP 110/76 | HR 67 | Temp 97.6°F | Ht 68.0 in | Wt 234.0 lb

## 2024-02-18 DIAGNOSIS — Z7984 Long term (current) use of oral hypoglycemic drugs: Secondary | ICD-10-CM

## 2024-02-18 DIAGNOSIS — M109 Gout, unspecified: Secondary | ICD-10-CM | POA: Diagnosis not present

## 2024-02-18 DIAGNOSIS — E1159 Type 2 diabetes mellitus with other circulatory complications: Secondary | ICD-10-CM

## 2024-02-18 DIAGNOSIS — E785 Hyperlipidemia, unspecified: Secondary | ICD-10-CM

## 2024-02-18 DIAGNOSIS — E1169 Type 2 diabetes mellitus with other specified complication: Secondary | ICD-10-CM | POA: Diagnosis not present

## 2024-02-18 DIAGNOSIS — I152 Hypertension secondary to endocrine disorders: Secondary | ICD-10-CM | POA: Diagnosis not present

## 2024-02-18 DIAGNOSIS — Z2821 Immunization not carried out because of patient refusal: Secondary | ICD-10-CM | POA: Diagnosis not present

## 2024-02-18 DIAGNOSIS — E1122 Type 2 diabetes mellitus with diabetic chronic kidney disease: Secondary | ICD-10-CM | POA: Diagnosis not present

## 2024-02-18 DIAGNOSIS — E1165 Type 2 diabetes mellitus with hyperglycemia: Secondary | ICD-10-CM

## 2024-02-18 DIAGNOSIS — E039 Hypothyroidism, unspecified: Secondary | ICD-10-CM

## 2024-02-18 DIAGNOSIS — I825Y2 Chronic embolism and thrombosis of unspecified deep veins of left proximal lower extremity: Secondary | ICD-10-CM | POA: Diagnosis not present

## 2024-02-18 LAB — CBC WITH DIFFERENTIAL/PLATELET
Basophils Absolute: 0 K/uL (ref 0.0–0.1)
Basophils Relative: 0.4 % (ref 0.0–3.0)
Eosinophils Absolute: 0.3 K/uL (ref 0.0–0.7)
Eosinophils Relative: 3.4 % (ref 0.0–5.0)
HCT: 40.1 % (ref 39.0–52.0)
Hemoglobin: 13.7 g/dL (ref 13.0–17.0)
Lymphocytes Relative: 29.1 % (ref 12.0–46.0)
Lymphs Abs: 2.5 K/uL (ref 0.7–4.0)
MCHC: 34 g/dL (ref 30.0–36.0)
MCV: 89.5 fl (ref 78.0–100.0)
Monocytes Absolute: 0.8 K/uL (ref 0.1–1.0)
Monocytes Relative: 9.3 % (ref 3.0–12.0)
Neutro Abs: 5.1 K/uL (ref 1.4–7.7)
Neutrophils Relative %: 57.8 % (ref 43.0–77.0)
Platelets: 255 K/uL (ref 150.0–400.0)
RBC: 4.48 Mil/uL (ref 4.22–5.81)
RDW: 13.8 % (ref 11.5–15.5)
WBC: 8.8 K/uL (ref 4.0–10.5)

## 2024-02-18 LAB — COMPREHENSIVE METABOLIC PANEL WITH GFR
ALT: 48 U/L (ref 0–53)
AST: 40 U/L — ABNORMAL HIGH (ref 0–37)
Albumin: 4.5 g/dL (ref 3.5–5.2)
Alkaline Phosphatase: 52 U/L (ref 39–117)
BUN: 17 mg/dL (ref 6–23)
CO2: 31 meq/L (ref 19–32)
Calcium: 9.7 mg/dL (ref 8.4–10.5)
Chloride: 101 meq/L (ref 96–112)
Creatinine, Ser: 1.07 mg/dL (ref 0.40–1.50)
GFR: 72.17 mL/min (ref >60.00)
Glucose, Bld: 76 mg/dL (ref 70–99)
Potassium: 3.9 meq/L (ref 3.5–5.1)
Sodium: 140 meq/L (ref 135–145)
Total Bilirubin: 0.5 mg/dL (ref 0.2–1.2)
Total Protein: 7.6 g/dL (ref 6.0–8.3)

## 2024-02-18 LAB — LIPID PANEL
Cholesterol: 182 mg/dL (ref 0–200)
HDL: 34 mg/dL — ABNORMAL LOW (ref >39.00)
LDL Cholesterol: 93 mg/dL (ref 0–99)
NonHDL: 147.81
Total CHOL/HDL Ratio: 5
Triglycerides: 272 mg/dL — ABNORMAL HIGH (ref 0.0–149.0)
VLDL: 54.4 mg/dL — ABNORMAL HIGH (ref 0.0–40.0)

## 2024-02-18 LAB — MICROALBUMIN / CREATININE URINE RATIO
Creatinine,U: 45.8 mg/dL
Microalb Creat Ratio: 109.4 mg/g — ABNORMAL HIGH (ref 0.0–30.0)
Microalb, Ur: 5 mg/dL — ABNORMAL HIGH (ref 0.0–1.9)

## 2024-02-18 LAB — URIC ACID: Uric Acid, Serum: 7.4 mg/dL (ref 4.0–7.8)

## 2024-02-18 LAB — TSH: TSH: 1.21 u[IU]/mL (ref 0.35–5.50)

## 2024-02-18 NOTE — Progress Notes (Signed)
 Subjective:     Patient ID: Earl Harvey, male    DOB: 06/05/1956, 68 y.o.   MRN: [id number]  Chief Complaint  Patient presents with   Medical Management of Chronic Issues    3 month f/u    HPI  Discussed the use of AI scribe software for clinical note transcription with the patient, who gave verbal consent to proceed.  History of Present Illness Earl Harvey is a 68 year old male who presents for follow-up of chronic health conditions.  Dyslipidemia and statin intolerance - Experiences adverse effects from pravastatin , including malaise and headaches - Has not trialed other statins - Currently managed with ezetimibe  - No current statin therapy due to intolerance  Hypertension - Blood pressure is well controlled - No associated symptoms such as chest pain, shortness of breath, or peripheral edema  Hypothyroidism - Chronic condition - No current symptoms reported  Diabetes mellitus - Managed by endocrinology - Current blood glucose approximately 100 mg/dL - Weight increased from 220 to 234 pounds despite regular physical activity - Weight gain and thinks it may be related to insulin  therapy  Chronic deep vein thrombosis, left lower extremity - Stable condition - Recent venous Doppler confirms stability - Completed over one year of apixaban  therapy  Prostate-specific antigen elevation - Previously elevated PSA, now decreased to 5.5 - No urology follow-up required for one year  Gout prophylaxis - On allopurinol  for prevention - No gout flare-ups since 1992  Preventive health and screening - Declined pneumonia and shingles vaccines at this visit - Colonoscopy scheduled for September, previously postponed due to eye surgery      Health Maintenance Due  Topic Date Due   Colonoscopy  01/30/2024   INFLUENZA VACCINE  02/04/2024    Past Medical History:  Diagnosis Date   Cataract Dec.2024   Diabetes mellitus    Diverticulitis    Gout     Hypercholesteremia    Hypertension    Kidney disease    Left foot pain 05/29/2022   Rash 07/30/2022   Thyroid  disease     Past Surgical History:  Procedure Laterality Date   CERVICAL DISCECTOMY     EYE SURGERY     JOINT REPLACEMENT  1995    Family History  Problem Relation Age of Onset   Hyperlipidemia Mother    COPD Mother    Hypertension Father    Hyperlipidemia Father    Arthritis Father    Alcohol abuse Father    Stroke Father    Heart failure Father    Heart disease Father    Hypertension Sister    Hyperlipidemia Sister    Hypertension Brother    Hyperlipidemia Brother    Depression Brother    Cancer Brother        lung   Arthritis Brother    Anxiety disorder Brother    Alcohol abuse Brother    Epilepsy Daughter    Cancer Brother     Social History   Socioeconomic History   Marital status: Divorced    Spouse name: Not on file   Number of children: 1   Years of education: 13   Highest education level: 12th grade  Occupational History   Occupation: RETIRED  Tobacco Use   Smoking status: Former    Current packs/day: 0.00    Types: Cigarettes    Quit date: 05/07/1991    Years since quitting: 32.8   Smokeless tobacco: Never  Vaping Use   Vaping status: Never  Used  Substance and Sexual Activity   Alcohol use: Not Currently    Comment: Occasionally   Drug use: No   Sexual activity: Not Currently  Other Topics Concern   Not on file  Social History Narrative   Has a roommate/2025   Social Drivers of Health   Financial Resource Strain: High Risk (02/01/2024)   Overall Financial Resource Strain (CARDIA)    Difficulty of Paying Living Expenses: Hard  Food Insecurity: No Food Insecurity (02/01/2024)   Hunger Vital Sign    Worried About Running Out of Food in the Last Year: Never true    Ran Out of Food in the Last Year: Never true  Transportation Needs: No Transportation Needs (02/01/2024)   PRAPARE - Administrator, Civil Service  (Medical): No    Lack of Transportation (Non-Medical): No  Physical Activity: Sufficiently Active (02/01/2024)   Exercise Vital Sign    Days of Exercise per Week: 4 days    Minutes of Exercise per Session: 40 min  Stress: No Stress Concern Present (02/01/2024)   [employer] of Occupational Health - Occupational Stress Questionnaire    Feeling of Stress: Not at all  Social Connections: Moderately Isolated (02/01/2024)   Social Connection and Isolation Panel    Frequency of Communication with Friends and Family: More than three times a week    Frequency of Social Gatherings with Friends and Family: Once a week    Attends Religious Services: 1 to 4 times per year    Active Member of [employer] or Organizations: No    Attends Engineer, structural: Not on file    Marital Status: Divorced  Intimate Partner Violence: Patient Unable To Answer (02/01/2024)   Humiliation, Afraid, Rape, and Kick questionnaire    Fear of Current or Ex-Partner: Patient unable to answer    Emotionally Abused: Patient unable to answer    Physically Abused: Patient unable to answer    Sexually Abused: Patient unable to answer    Outpatient Medications Prior to Visit  Medication Sig Dispense Refill   allopurinol  (ZYLOPRIM ) 300 MG tablet Take 1 tablet (300 mg total) by mouth every other day. 30 tablet 4   aspirin EC 81 MG tablet Take 81 mg by mouth daily. Swallow whole.     Blood Glucose Monitoring Suppl DEVI Use to check blood sugars. May substitute to any manufacturer covered by patient's insurance. 1 each 0   chlorthalidone  (HYGROTON ) 25 MG tablet Take 1 tablet (25 mg total) by mouth daily. 90 tablet 1   Cholecalciferol (VITAMIN D3) 125 MCG (5000 UT) TABS Take 1 tablet by mouth daily.     Continuous Glucose Sensor (DEXCOM G7 SENSOR) MISC 1 Device by Does not apply route continuous. 9 each 3   ezetimibe  (ZETIA ) 10 MG tablet Take 1 tablet by mouth once daily 90 tablet 0   fish oil-omega-3 fatty acids 1000 MG  capsule Take 2 g by mouth daily.       Garlic 500 MG TABS Take 1 tablet by mouth daily.       glimepiride  (AMARYL ) 2 MG tablet Take 1 tablet (2 mg total) by mouth 2 (two) times daily with a meal. 180 tablet 3   Glucagon  (BAQSIMI  ONE PACK) 3 MG/DOSE POWD Place 1 Device into the nose as needed (Low blood sugar with impaired consciousness). 2 each 3   Glucose Blood (BLOOD GLUCOSE TEST STRIPS) STRP Use to check blood sugars 2 times daily. May substitute to any manufacturer covered  by patient's insurance. 200 strip 6   hydrOXYzine  (ATARAX ) 25 MG tablet Take 1 tablet (25 mg total) by mouth every 6 (six) hours as needed for itching. 12 tablet 0   ibuprofen  (ADVIL ,MOTRIN ) 800 MG tablet Take 1 tablet (800 mg total) by mouth 3 (three) times daily. 21 tablet 0   insulin  regular human CONCENTRATED (HUMULIN  R U-500 KWIKPEN) 500 UNIT/ML KwikPen 75 units before break fast, 55 units before lunch and 55 units before supper - 30 min before meals 39 mL 3   Lancet Device MISC Use to check blood sugars. May substitute to any manufacturer covered by patient's insurance. 1 each 0   Lancets Misc. MISC Use to check blood sugars 2 times daily. May substitute to any manufacturer covered by patient's insurance. 200 each 6   levocetirizine (XYZAL ) 5 MG tablet Take 1 tablet (5 mg total) by mouth every evening. 90 tablet 3   levothyroxine  (SYNTHROID ) 125 MCG tablet Take 1 tablet (125 mcg total) by mouth every morning. 90 tablet 1   losartan  (COZAAR ) 50 MG tablet Take 1 tablet by mouth once daily 90 tablet 0   Multiple Vitamins-Minerals (MULTIVITAMINS THER. W/MINERALS) TABS Take 1 tablet by mouth daily.       triamcinolone  ointment (KENALOG ) 0.5 % Apply 1 Application topically 2 (two) times daily. 30 g 0   Turmeric (QC TUMERIC COMPLEX PO) Take by mouth daily.     pravastatin  (PRAVACHOL ) 10 MG tablet Take 1 tablet (10 mg total) by mouth daily. (Patient not taking: Reported on 02/18/2024) 30 tablet 4   cephALEXin  (KEFLEX ) 500 MG  capsule Take 1 capsule (500 mg total) by mouth 3 (three) times daily. (Patient not taking: Reported on 02/18/2024) 21 capsule 0   No facility-administered medications prior to visit.    No Known Allergies  Review of Systems  Constitutional:  Negative for chills, fever, malaise/fatigue and weight loss.  Eyes:  Negative for blurred vision and double vision.  Respiratory:  Negative for cough and shortness of breath.   Cardiovascular:  Negative for chest pain, palpitations, orthopnea and leg swelling.  Gastrointestinal:  Negative for abdominal pain, constipation, diarrhea, nausea and vomiting.  Genitourinary:  Negative for dysuria, frequency and urgency.  Musculoskeletal:  Negative for falls.  Neurological:  Negative for dizziness, focal weakness and headaches.  Psychiatric/Behavioral:  Negative for depression.        Objective:    Physical Exam Constitutional:      General: He is not in acute distress.    Appearance: He is not ill-appearing.  HENT:     Mouth/Throat:     Mouth: Mucous membranes are moist.     Pharynx: Oropharynx is clear.  Eyes:     Extraocular Movements: Extraocular movements intact.     Conjunctiva/sclera: Conjunctivae normal.  Cardiovascular:     Rate and Rhythm: Normal rate and regular rhythm.  Pulmonary:     Effort: Pulmonary effort is normal.     Breath sounds: Normal breath sounds.  Musculoskeletal:        General: Normal range of motion.     Cervical back: Normal range of motion and neck supple.     Right lower leg: No edema.     Left lower leg: No edema.  Skin:    General: Skin is warm and dry.  Neurological:     General: No focal deficit present.     Mental Status: He is alert and oriented to person, place, and time.     Cranial Nerves:  No cranial nerve deficit.     Motor: No weakness.     Coordination: Coordination normal.     Gait: Gait normal.  Psychiatric:        Mood and Affect: Mood normal.        Behavior: Behavior normal.         Thought Content: Thought content normal.      BP 110/76 (BP Location: Left Arm, Patient Position: Sitting)   Pulse 67   Temp 97.6 F (36.4 C) (Temporal)   Ht 5' 8 (1.727 m)   Wt 234 lb (106.1 kg)   SpO2 98%   BMI 35.58 kg/m  Wt Readings from Last 3 Encounters:  02/18/24 234 lb (106.1 kg)  02/04/24 233 lb 11.2 oz (106 kg)  02/01/24 229 lb (103.9 kg)       Assessment & Plan:   Problem List Items Addressed This Visit     Acquired hypothyroidism   Relevant Orders   TSH (Completed)   Chronic kidney disease due to type 2 diabetes mellitus (HCC)   Relevant Orders   CBC with Differential/Platelet (Completed)   Comprehensive metabolic panel with GFR (Completed)   Deep vein thrombosis (DVT) of proximal vein of left lower extremity (HCC)   Gout   Relevant Orders   Uric acid (Completed)   Hyperlipidemia associated with type 2 diabetes mellitus (HCC) - Primary   Relevant Orders   Lipid panel (Completed)   Comprehensive metabolic panel with GFR (Completed)   Hypertension associated with type 2 diabetes mellitus (HCC)   Relevant Orders   TSH (Completed)   CBC with Differential/Platelet (Completed)   Comprehensive metabolic panel with GFR (Completed)   Long term current use of oral hypoglycemic drug   Uncontrolled type 2 diabetes mellitus with hyperglycemia (HCC)   Relevant Orders   Microalbumin / creatinine urine ratio (Completed)   CBC with Differential/Platelet (Completed)   Comprehensive metabolic panel with GFR (Completed)   Other Visit Diagnoses       Immunization declined          Assessment and Plan Assessment & Plan Hypertension Blood pressure is well-controlled with no reports of shortness of breath, chest pain, or swelling.  Hyperlipidemia Hyperlipidemia is not well-controlled. Pravastatin  caused adverse effects including malaise and headaches. Ezetimibe  is insufficient to control LDL levels. Rosuvastatin (Crestor) was discussed as an alternative,  acknowledging potential similar side effects, but with better tolerance in many patients. - Check cholesterol levels today - Consider trial of rosuvastatin if cholesterol levels are not at goal  Hypothyroidism Hypothyroidism is managed with medication without specific issues reported. - Check thyroid  levels today  Chronic DVT of left lower extremity Chronic DVT is well-managed. Recent venous Doppler is stable compared to last year. Eliquis  has been discontinued per vascular specialist's recommendation.  Gout, on allopurinol  prophylaxis Gout is well-controlled with allopurinol . No flare-ups since 1992. - Check uric acid levels today  Uncontrolled diabetes Diabetes is managed by endocrinology. He uses an insulin  monitor with current blood sugar levels around 100. Concern about weight gain potentially related to insulin  use will be discussed with the endocrinologist. - Check urine protein today - Coordinate diabetes management with endocrinologist    I have discontinued Tion M. Dambach's cephALEXin . I am also having him maintain his fish oil-omega-3 fatty acids, Garlic, multivitamins ther. w/minerals, ibuprofen , Turmeric (QC TUMERIC COMPLEX PO), Vitamin D3, aspirin EC, triamcinolone  ointment, levocetirizine, hydrOXYzine , Blood Glucose Monitoring Suppl, BLOOD GLUCOSE TEST STRIPS, Lancet Device, Lancets Misc., allopurinol , chlorthalidone , levothyroxine , Dexcom G7  Sensor, Baqsimi  One Pack, losartan , HumuLIN  R U-500 KwikPen, pravastatin , ezetimibe , and glimepiride .  No orders of the defined types were placed in this encounter.

## 2024-02-20 ENCOUNTER — Ambulatory Visit: Payer: Self-pay | Admitting: Family Medicine

## 2024-02-20 NOTE — Progress Notes (Signed)
 Please reach out to get his answer regarding cholesterol medication.  Your cholesterol is not in goal range. Are you willing to take a different statin from the one you were taking such as rosuvastatin (Crestor) or atorvastatin (Lipitor)? Let me know please.

## 2024-02-22 NOTE — Progress Notes (Signed)
 His urine is positive for protein. This is due to having diabetes and other chronic health conditions. I would like to increase his losartan  dose to 100 mg daily. I will send this to his pharmacy. Also, please ask him to come in for an office visit in 3-4 weeks.

## 2024-02-23 ENCOUNTER — Telehealth: Payer: Self-pay

## 2024-02-23 ENCOUNTER — Other Ambulatory Visit: Payer: Self-pay | Admitting: Family Medicine

## 2024-02-23 MED ORDER — CHLORTHALIDONE 25 MG PO TABS: 25.0000 mg | ORAL_TABLET | Freq: Every day | ORAL | 1 refills | Status: AC

## 2024-02-23 MED ORDER — LOSARTAN POTASSIUM 50 MG PO TABS: 50.0000 mg | ORAL_TABLET | Freq: Every day | ORAL | 1 refills | Status: DC

## 2024-02-23 NOTE — Telephone Encounter (Signed)
 Copied from CRM #[id number]. Topic: Clinical - Medical Advice >> Feb 23, 2024  9:21 AM Mercedes MATSU wrote: Reason for CRM: Patient states that NP Lendia wanted him to start Crestor, and he states he will not do that right now. He just wanted to inform her. He said his cholesterol is fine and he would try other methods. Pt states that if NP Lendia has any questions she can contact him via phone at [phone].

## 2024-02-23 NOTE — Telephone Encounter (Signed)
 Copied from CRM #[id number]. Topic: Clinical - Medication Refill >> Feb 23, 2024  9:18 AM Tanazia G wrote: Medication:  losartan  (COZAAR ) 50 MG tablet  chlorthalidone  (HYGROTON ) 25 MG tablet   Has the patient contacted their pharmacy? Yes (Agent: If no, request that the patient contact the pharmacy for the refill. If patient does not wish to contact the pharmacy document the reason why and proceed with request.) (Agent: If yes, when and what did the pharmacy advise?)  This is the patient's preferred pharmacy:  [facility] [address] ([address]), [place] - [address] [place] ([place]) [place] [address] Phone: [phone] Fax: [phone]  Is this the correct pharmacy for this prescription? Yes If no, delete pharmacy and type the correct one.   Has the prescription been filled recently? Yes  Is the patient out of the medication? Yes  Has the patient been seen for an appointment in the last year OR does the patient have an upcoming appointment? Yes  Can we respond through MyChart? Yes  Agent: Please be advised that Rx refills may take up to 3 business days. We ask that you follow-up with your pharmacy.

## 2024-02-24 ENCOUNTER — Other Ambulatory Visit: Payer: Self-pay | Admitting: Family Medicine

## 2024-02-24 MED ORDER — LOSARTAN POTASSIUM 100 MG PO TABS: 100.0000 mg | ORAL_TABLET | Freq: Every day | ORAL | 0 refills | Status: DC

## 2024-03-02 ENCOUNTER — Ambulatory Visit (INDEPENDENT_AMBULATORY_CARE_PROVIDER_SITE_OTHER): Admitting: "Endocrinology

## 2024-03-02 ENCOUNTER — Encounter: Payer: Self-pay | Admitting: "Endocrinology

## 2024-03-02 VITALS — BP 130/80 | HR 81 | Ht 68.0 in | Wt 233.0 lb

## 2024-03-02 DIAGNOSIS — E1165 Type 2 diabetes mellitus with hyperglycemia: Secondary | ICD-10-CM | POA: Diagnosis not present

## 2024-03-02 DIAGNOSIS — Z7984 Long term (current) use of oral hypoglycemic drugs: Secondary | ICD-10-CM

## 2024-03-02 DIAGNOSIS — E782 Mixed hyperlipidemia: Secondary | ICD-10-CM | POA: Diagnosis not present

## 2024-03-02 DIAGNOSIS — Z794 Long term (current) use of insulin: Secondary | ICD-10-CM | POA: Diagnosis not present

## 2024-03-02 LAB — POCT GLYCOSYLATED HEMOGLOBIN (HGB A1C): Hemoglobin A1C: 7.1 % — AB (ref 4.0–5.6)

## 2024-03-02 NOTE — Progress Notes (Signed)
 Outpatient Endocrinology Note Earl Birmingham, MD  03/02/24   Earl Harvey [id number] [id number]  Referring Provider: Lendia Boby CROME, NP-C Primary Care Provider: Lendia Boby CROME, NP-C Reason for consultation: Subjective   Assessment & Plan  Diagnoses and all orders for this visit:  Uncontrolled type 2 diabetes mellitus with hyperglycemia (HCC) -     POCT glycosylated hemoglobin (Hb A1C)  Long term (current) use of oral hypoglycemic drugs  Long-term insulin  use (HCC)  Mixed hypercholesterolemia and hypertriglyceridemia   Diabetes Type II complicated by neuropathy,  Lab Results  Component Value Date   GFR 72.17 02/18/2024   Hba1c goal less than 7, current Hba1c is  Lab Results  Component Value Date   HGBA1C 8.8 (A) 11/18/2023   Will recommend the following: U500 INSULIN : 75 units before break fast, 70 units before lunch and 75 units before supper - 30 min before meals  Stop Glimepiride  2 mg bid Ordered DM education previously   Patient has been playing with his insulin  doses, to 50 units of Lantus  yesterday instead of 100 units, instructed against it We stopped Lantus  100 units every day and stopped Humalog  24 units before break fast and lunch and 28 units before dinner - 15 min before meals   No known contraindications/side effects to any of above medications Glucagon  discussed and prescribed with refills on 67/15/25   Not interested in GLP1, calls them black box/deadly  -Last LD and Tg are as follows: Lab Results  Component Value Date   LDLCALC 93 02/18/2024    Lab Results  Component Value Date   TRIG 272.0 (H) 02/18/2024   -not on statin (11/18/23: reports problems with pravastatin  and refuses to take it), on ezetimibe  10 mg every day and fish oil-omega-3 fatty acids 1000 MG 2 capsule 2 gms daily  -Follow low fat diet and exercise   -Blood pressure goal <140/90 - Microalbumin/creatinine goal is < 30 -Last MA/Cr is as follows: Lab Results   Component Value Date   MICROALBUR 5.0 (H) 02/18/2024   -on ACE/ARB losartan  100 mg every day  -diet changes including salt restriction -limit eating outside -counseled BP targets per standards of diabetes care -uncontrolled blood pressure can lead to retinopathy, nephropathy and cardiovascular and atherosclerotic heart disease  Reviewed and counseled on: -A1C target -Blood sugar targets -Complications of uncontrolled diabetes  -Checking blood sugar before meals and bedtime and bring log next visit -All medications with mechanism of action and side effects -Hypoglycemia management: rule of 15's, Glucagon  Emergency Kit and medical alert ID -low-carb low-fat plate-method diet -At least 20 minutes of physical activity per day -Annual dilated retinal eye exam and foot exam -compliance and follow up needs -follow up as scheduled or earlier if problem gets worse  Call if blood sugar is less than 67 or consistently above 250    Take a 15 gm snack of carbohydrate at bedtime before you go to sleep if your blood sugar is less than 100.    If you are going to fast after midnight for a test or procedure, ask your physician for instructions on how to reduce/decrease your insulin  dose.    Call if blood sugar is less than 67 or consistently above 250  -Treating a low sugar by rule of 15  (15 gms of sugar every 15 min until sugar is more than 70) If you feel your sugar is low, test your sugar to be sure If your sugar is low (less than 70), then take  67 grams of a fast acting Carbohydrate (3-4 glucose tablets or glucose gel or 4 ounces of juice or regular soda) Recheck your sugar 67 min after treating low to make sure it is more than 70 If sugar is still less than 70, treat again with 15 grams of carbohydrate          Don't drive the hour of hypoglycemia  If unconscious/unable to eat or drink by mouth, use glucagon  injection or nasal spray baqsimi  and call 911. Can repeat again in 15 min if still  unconscious.  Return in about 3 months (around 06/02/2024).   I have reviewed current medications, nurse's notes, allergies, vital signs, past medical and surgical history, family medical history, and social history for this encounter. Counseled patient on symptoms, examination findings, lab findings, imaging results, treatment decisions and monitoring and prognosis. The patient understood the recommendations and agrees with the treatment plan. All questions regarding treatment plan were fully answered.  Earl Birmingham, MD  03/02/24    History of Present Illness Earl Harvey is a 68 y.o. year old male who presents for follow of Type II diabetes mellitus.  Earl Harvey was first diagnosed before 1995.   Diabetes education +  Home diabetes regimen: U500 INSULIN : 70 units before break fast, 65 units before lunch and 65 units before supper - 30 min before meals  Glimepiride  2 mg bid  Glimepiride  2 mg bid Previously, Lantus  100 units every day Humalog  22-24 units tidac - 15 min before meals   COMPLICATIONS -  MI/Stroke -  retinopathy +  neuropathy -  nephropathy  BLOOD SUGAR DATA CGM interpretation: At today's visit, we reviewed her CGM downloads. The full report is scanned in the media. Reviewing the CGM trends, BG are elevated after all meals, dinner>other meals.   Physical Exam  BP 130/80   Pulse 81   Ht 5' 8 (1.727 m)   Wt 233 lb (105.7 kg)   SpO2 98%   BMI 35.43 kg/m    Constitutional: well developed, well nourished Head: normocephalic, atraumatic Eyes: sclera anicteric, no redness Neck: supple Lungs: normal respiratory effort Neurology: alert and oriented Skin: dry, no appreciable rashes Musculoskeletal: no appreciable defects Psychiatric: normal mood and affect Diabetic Foot Exam - Simple   No data filed      Current Medications Patient's Medications  New Prescriptions   No medications on file  Previous Medications   ALLOPURINOL  (ZYLOPRIM ) 300 MG  TABLET    Take 1 tablet (300 mg total) by mouth every other day.   ASPIRIN EC 81 MG TABLET    Take 81 mg by mouth daily. Swallow whole.   BLOOD GLUCOSE MONITORING SUPPL DEVI    Use to check blood sugars. May substitute to any manufacturer covered by patient's insurance.   CHLORTHALIDONE  (HYGROTON ) 25 MG TABLET    Take 1 tablet (25 mg total) by mouth daily.   CHOLECALCIFEROL (VITAMIN D3) 125 MCG (5000 UT) TABS    Take 1 tablet by mouth daily.   CONTINUOUS GLUCOSE SENSOR (DEXCOM G7 SENSOR) MISC    1 Device by Does not apply route continuous.   EZETIMIBE  (ZETIA ) 10 MG TABLET    Take 1 tablet by mouth once daily   FISH OIL-OMEGA-3 FATTY ACIDS 1000 MG CAPSULE    Take 2 g by mouth daily.     GARLIC 500 MG TABS    Take 1 tablet by mouth daily.     GLUCAGON  (BAQSIMI  ONE PACK) 3 MG/DOSE POWD  Place 1 Device into the nose as needed (Low blood sugar with impaired consciousness).   GLUCOSE BLOOD (BLOOD GLUCOSE TEST STRIPS) STRP    Use to check blood sugars 2 times daily. May substitute to any manufacturer covered by patient's insurance.   HYDROXYZINE  (ATARAX ) 25 MG TABLET    Take 1 tablet (25 mg total) by mouth every 6 (six) hours as needed for itching.   IBUPROFEN  (ADVIL ,MOTRIN ) 800 MG TABLET    Take 1 tablet (800 mg total) by mouth 3 (three) times daily.   INSULIN  REGULAR HUMAN CONCENTRATED (HUMULIN  R U-500 KWIKPEN) 500 UNIT/ML KWIKPEN    75 units before break fast, 55 units before lunch and 55 units before supper - 30 min before meals   LANCET DEVICE MISC    Use to check blood sugars. May substitute to any manufacturer covered by patient's insurance.   LANCETS MISC. MISC    Use to check blood sugars 2 times daily. May substitute to any manufacturer covered by patient's insurance.   LEVOCETIRIZINE (XYZAL ) 5 MG TABLET    Take 1 tablet (5 mg total) by mouth every evening.   LEVOTHYROXINE  (SYNTHROID ) 125 MCG TABLET    Take 1 tablet (125 mcg total) by mouth every morning.   LOSARTAN  (COZAAR ) 100 MG TABLET     Take 1 tablet (100 mg total) by mouth daily.   MULTIPLE VITAMINS-MINERALS (MULTIVITAMINS THER. W/MINERALS) TABS    Take 1 tablet by mouth daily.     PRAVASTATIN  (PRAVACHOL ) 10 MG TABLET    Take 1 tablet (10 mg total) by mouth daily.   TRIAMCINOLONE  OINTMENT (KENALOG ) 0.5 %    Apply 1 Application topically 2 (two) times daily.   TURMERIC (QC TUMERIC COMPLEX PO)    Take by mouth daily.  Modified Medications   No medications on file  Discontinued Medications   GLIMEPIRIDE  (AMARYL ) 2 MG TABLET    Take 1 tablet (2 mg total) by mouth 2 (two) times daily with a meal.    Allergies No Known Allergies  Past Medical History Past Medical History:  Diagnosis Date   Cataract Dec.2024   Diabetes mellitus    Diverticulitis    Gout    Hypercholesteremia    Hypertension    Kidney disease    Left foot pain 05/29/2022   Rash 07/30/2022   Thyroid  disease     Past Surgical History Past Surgical History:  Procedure Laterality Date   CERVICAL DISCECTOMY     EYE SURGERY     JOINT REPLACEMENT  1995    Family History family history includes Alcohol abuse in his brother and father; Anxiety disorder in his brother; Arthritis in his brother and father; COPD in his mother; Cancer in his brother and brother; Depression in his brother; Epilepsy in his daughter; Heart disease in his father; Heart failure in his father; Hyperlipidemia in his brother, father, mother, and sister; Hypertension in his brother, father, and sister; Stroke in his father.  Social History Social History   Socioeconomic History   Marital status: Divorced    Spouse name: Not on file   Number of children: 1   Years of education: 13   Highest education level: 12th grade  Occupational History   Occupation: RETIRED  Tobacco Use   Smoking status: Former    Current packs/day: 0.00    Types: Cigarettes    Quit date: 05/07/1991    Years since quitting: 32.8   Smokeless tobacco: Never  Vaping Use   Vaping status: Never Used  Substance and Sexual Activity   Alcohol use: Not Currently    Comment: Occasionally   Drug use: No   Sexual activity: Not Currently  Other Topics Concern   Not on file  Social History Narrative   Has a roommate/2025   Social Drivers of Health   Financial Resource Strain: High Risk (02/01/2024)   Overall Financial Resource Strain (CARDIA)    Difficulty of Paying Living Expenses: Hard  Food Insecurity: No Food Insecurity (02/01/2024)   Hunger Vital Sign    Worried About Running Out of Food in the Last Year: Never true    Ran Out of Food in the Last Year: Never true  Transportation Needs: No Transportation Needs (02/01/2024)   PRAPARE - Administrator, Civil Service (Medical): No    Lack of Transportation (Non-Medical): No  Physical Activity: Sufficiently Active (02/01/2024)   Exercise Vital Sign    Days of Exercise per Week: 4 days    Minutes of Exercise per Session: 40 min  Stress: No Stress Concern Present (02/01/2024)   [employer] of Occupational Health - Occupational Stress Questionnaire    Feeling of Stress: Not at all  Social Connections: Moderately Isolated (02/01/2024)   Social Connection and Isolation Panel    Frequency of Communication with Friends and Family: More than three times a week    Frequency of Social Gatherings with Friends and Family: Once a week    Attends Religious Services: 1 to 4 times per year    Active Member of Clubs or Organizations: No    Attends Banker Meetings: Not on file    Marital Status: Divorced  Intimate Partner Violence: Patient Unable To Answer (02/01/2024)   Humiliation, Afraid, Rape, and Kick questionnaire    Fear of Current or Ex-Partner: Patient unable to answer    Emotionally Abused: Patient unable to answer    Physically Abused: Patient unable to answer    Sexually Abused: Patient unable to answer    Lab Results  Component Value Date   HGBA1C 8.8 (A) 11/18/2023   HGBA1C 9.2 (H) 10/26/2023    HGBA1C 9.0 (H) 07/28/2023   Lab Results  Component Value Date   CHOL 182 02/18/2024   Lab Results  Component Value Date   HDL 34.00 (L) 02/18/2024   Lab Results  Component Value Date   LDLCALC 93 02/18/2024   Lab Results  Component Value Date   TRIG 272.0 (H) 02/18/2024   Lab Results  Component Value Date   CHOLHDL 5 02/18/2024   Lab Results  Component Value Date   CREATININE 1.07 02/18/2024   Lab Results  Component Value Date   GFR 72.17 02/18/2024   Lab Results  Component Value Date   MICROALBUR 5.0 (H) 02/18/2024      Component Value Date/Time   NA 140 02/18/2024 1137   NA 142 04/08/2023 0830   K 3.9 02/18/2024 1137   CL 101 02/18/2024 1137   CO2 31 02/18/2024 1137   GLUCOSE 76 02/18/2024 1137   BUN 17 02/18/2024 1137   BUN 28 (H) 04/08/2023 0830   CREATININE 1.07 02/18/2024 1137   CALCIUM 9.7 02/18/2024 1137   PROT 7.6 02/18/2024 1137   PROT 6.9 04/08/2023 0830   ALBUMIN 4.5 02/18/2024 1137   ALBUMIN 4.3 04/08/2023 0830   AST 40 (H) 02/18/2024 1137   ALT 48 02/18/2024 1137   ALKPHOS 52 02/18/2024 1137   BILITOT 0.5 02/18/2024 1137   BILITOT <0.2 04/08/2023 0830   GFRNONAA  52 (L) 01/04/2022 1303      Latest Ref Rng & Units 02/18/2024   11:37 AM 10/26/2023    9:03 AM 07/28/2023    2:02 PM  BMP  Glucose 70 - 99 mg/dL 76  829  682   BUN 6 - 23 mg/dL 17  29  23    Creatinine 0.40 - 1.50 mg/dL 8.92  8.78  8.68   Sodium 135 - 145 mEq/L 140  137  137   Potassium 3.5 - 5.1 mEq/L 3.9  3.9  4.1   Chloride 96 - 112 mEq/L 101  101  97   CO2 19 - 32 mEq/L 31  26  31    Calcium 8.4 - 10.5 mg/dL 9.7  89.9  9.9        Component Value Date/Time   WBC 8.8 02/18/2024 1137   RBC 4.48 02/18/2024 1137   HGB 13.7 02/18/2024 1137   HGB 13.6 04/08/2023 0830   HCT 40.1 02/18/2024 1137   HCT 40.7 04/08/2023 0830   PLT 255.0 02/18/2024 1137   PLT 247 04/08/2023 0830   MCV 89.5 02/18/2024 1137   MCV 90 04/08/2023 0830   MCH 30.0 04/08/2023 0830   MCH 30.5  01/04/2022 1303   MCHC 34.0 02/18/2024 1137   RDW 13.8 02/18/2024 1137   RDW 13.0 04/08/2023 0830   LYMPHSABS 2.5 02/18/2024 1137   LYMPHSABS 2.2 04/08/2023 0830   MONOABS 0.8 02/18/2024 1137   EOSABS 0.3 02/18/2024 1137   EOSABS 0.2 04/08/2023 0830   BASOSABS 0.0 02/18/2024 1137   BASOSABS 0.0 04/08/2023 0830     Parts of this note may have been dictated using voice recognition software. There may be variances in spelling and vocabulary which are unintentional. Not all errors are proofread. Please notify the dino if any discrepancies are noted or if the meaning of any statement is not clear.

## 2024-03-02 NOTE — Patient Instructions (Addendum)
 Will recommend the following: U500 INSULIN : 75 units before break fast, 70 units before lunch and 75 units before supper - 30 min before meals  Stop Glimepiride  2 mg bid  _______   Goals of DM therapy:  Morning Fasting blood sugar: 80-140  Blood sugar before meals: 80-140 Bed time blood sugar: 100-150  A1C <7%, limited only by hypoglycemia  1.Diabetes medications and their side effects discussed, including hypoglycemia    2. Check blood glucose:  a) Always check blood sugars before driving. Please see below (under hypoglycemia) on how to manage b) Check a minimum of 3 times/day or more as needed when having symptoms of hypoglycemia.   c) Try to check blood glucose before sleeping/in the middle of the night to ensure that it is remaining stable and not dropping less than 100 d) Check blood glucose more often if sick  3. Diet: a) 3 meals per day schedule b: Restrict carbs to 60-70 grams (4 servings) per meal c) Colorful vegetables - 3 servings a day, and low sugar fruit 2 servings/day Plate control method: 1/4 plate protein, 1/4 starch, 1/2 green, yellow, or red vegetables d) Avoid carbohydrate snacks unless hypoglycemic episode, or increased physical activity  4. Regular exercise as tolerated, preferably 3 or more hours a week  5. Hypoglycemia: a)  Do not drive or operate machinery without first testing blood glucose to assure it is over 90 mg%, or if dizzy, lightheaded, not feeling normal, etc, or  if foot or leg is numb or weak. b)  If blood glucose less than 70, take four 5gm Glucose tabs or 15-30 gm Glucose gel.  Repeat every 15 min as needed until blood sugar is >100 mg/dl. If hypoglycemia persists then call 911.   6. Sick day management: a) Check blood glucose more often b) Continue usual therapy if blood sugars are elevated.   7. Contact the doctor immediately if blood glucose is frequently <60 mg/dl, or an episode of severe hypoglycemia occurs (where someone had to  give you glucose/  glucagon  or if you passed out from a low blood glucose), or if blood glucose is persistently >350 mg/dl, for further management  8. A change in level of physical activity or exercise and a change in diet may also affect your blood sugar. Check blood sugars more often and call if needed.  Instructions: 1. Bring glucose meter, blood glucose records on every visit for review 2. Continue to follow up with primary care physician and other providers for medical care 3. Yearly eye  and foot exam 4. Please get blood work done prior to the next appointment

## 2024-03-07 ENCOUNTER — Encounter: Payer: Self-pay | Admitting: "Endocrinology

## 2024-03-13 ENCOUNTER — Ambulatory Visit (AMBULATORY_SURGERY_CENTER)

## 2024-03-13 ENCOUNTER — Other Ambulatory Visit: Payer: Self-pay

## 2024-03-13 VITALS — Ht 68.0 in | Wt 230.0 lb

## 2024-03-13 DIAGNOSIS — Z1211 Encounter for screening for malignant neoplasm of colon: Secondary | ICD-10-CM

## 2024-03-13 MED ORDER — NA SULFATE-K SULFATE-MG SULF 17.5-3.13-1.6 GM/177ML PO SOLN: 1.0000 | Freq: Once | ORAL | 0 refills | Status: AC

## 2024-03-13 NOTE — Progress Notes (Signed)
 Denies allergies to eggs or soy products. Denies complication of anesthesia or sedation. Denies use of weight loss medication. Denies use of O2.   Emmi instructions given for colonoscopy.

## 2024-03-15 ENCOUNTER — Encounter (INDEPENDENT_AMBULATORY_CARE_PROVIDER_SITE_OTHER): Admitting: Ophthalmology

## 2024-03-15 DIAGNOSIS — E113391 Type 2 diabetes mellitus with moderate nonproliferative diabetic retinopathy without macular edema, right eye: Secondary | ICD-10-CM | POA: Diagnosis not present

## 2024-03-15 DIAGNOSIS — H35033 Hypertensive retinopathy, bilateral: Secondary | ICD-10-CM | POA: Diagnosis not present

## 2024-03-15 DIAGNOSIS — Z7984 Long term (current) use of oral hypoglycemic drugs: Secondary | ICD-10-CM

## 2024-03-15 DIAGNOSIS — I10 Essential (primary) hypertension: Secondary | ICD-10-CM | POA: Diagnosis not present

## 2024-03-15 DIAGNOSIS — H59032 Cystoid macular edema following cataract surgery, left eye: Secondary | ICD-10-CM

## 2024-03-15 DIAGNOSIS — Z794 Long term (current) use of insulin: Secondary | ICD-10-CM | POA: Diagnosis not present

## 2024-03-15 DIAGNOSIS — H43813 Vitreous degeneration, bilateral: Secondary | ICD-10-CM | POA: Diagnosis not present

## 2024-03-15 DIAGNOSIS — E113512 Type 2 diabetes mellitus with proliferative diabetic retinopathy with macular edema, left eye: Secondary | ICD-10-CM

## 2024-03-16 ENCOUNTER — Ambulatory Visit: Admitting: Family Medicine

## 2024-03-20 ENCOUNTER — Encounter (INDEPENDENT_AMBULATORY_CARE_PROVIDER_SITE_OTHER): Admitting: Ophthalmology

## 2024-03-21 ENCOUNTER — Other Ambulatory Visit: Payer: Self-pay | Admitting: "Endocrinology

## 2024-03-21 DIAGNOSIS — E1165 Type 2 diabetes mellitus with hyperglycemia: Secondary | ICD-10-CM

## 2024-03-27 ENCOUNTER — Encounter: Payer: Self-pay | Admitting: Gastroenterology

## 2024-03-27 ENCOUNTER — Ambulatory Visit (AMBULATORY_SURGERY_CENTER): Admitting: Gastroenterology

## 2024-03-27 VITALS — BP 136/65 | HR 66 | Temp 98.4°F | Resp 10 | Ht 68.0 in | Wt 230.0 lb

## 2024-03-27 DIAGNOSIS — D124 Benign neoplasm of descending colon: Secondary | ICD-10-CM | POA: Diagnosis not present

## 2024-03-27 DIAGNOSIS — K648 Other hemorrhoids: Secondary | ICD-10-CM | POA: Diagnosis not present

## 2024-03-27 DIAGNOSIS — Z1211 Encounter for screening for malignant neoplasm of colon: Secondary | ICD-10-CM | POA: Diagnosis not present

## 2024-03-27 DIAGNOSIS — D125 Benign neoplasm of sigmoid colon: Secondary | ICD-10-CM | POA: Diagnosis not present

## 2024-03-27 DIAGNOSIS — K5730 Diverticulosis of large intestine without perforation or abscess without bleeding: Secondary | ICD-10-CM

## 2024-03-27 DIAGNOSIS — K641 Second degree hemorrhoids: Secondary | ICD-10-CM

## 2024-03-27 DIAGNOSIS — D122 Benign neoplasm of ascending colon: Secondary | ICD-10-CM | POA: Diagnosis not present

## 2024-03-27 MED ORDER — SODIUM CHLORIDE 0.9 % IV SOLN: 500.0000 mL | Freq: Once | INTRAVENOUS | Status: DC

## 2024-03-27 NOTE — Patient Instructions (Signed)
 Resume previous diet. Continue present medications. Awaiting pathology results. Repeat colonoscopy for surveillance based on pathology results. Return to GI clinic as needed. Handouts provided on polyps, diverticulosis, and hemorrhoids.  YOU HAD AN ENDOSCOPIC PROCEDURE TODAY AT THE [facility]:   Refer to the procedure report that was given to you for any specific questions about what was found during the examination.  If the procedure report does not answer your questions, please call your gastroenterologist to clarify.  If you requested that your care partner not be given the details of your procedure findings, then the procedure report has been included in a sealed envelope for you to review at your convenience later.  YOU SHOULD EXPECT: Some feelings of bloating in the abdomen. Passage of more gas than usual.  Walking can help get rid of the air that was put into your GI tract during the procedure and reduce the bloating. If you had a lower endoscopy (such as a colonoscopy or flexible sigmoidoscopy) you may notice spotting of blood in your stool or on the toilet paper. If you underwent a bowel prep for your procedure, you may not have a normal bowel movement for a few days.  Please Note:  You might notice some irritation and congestion in your nose or some drainage.  This is from the oxygen used during your procedure.  There is no need for concern and it should clear up in a day or so.  SYMPTOMS TO REPORT IMMEDIATELY:  Following lower endoscopy (colonoscopy or flexible sigmoidoscopy):  Excessive amounts of blood in the stool  Significant tenderness or worsening of abdominal pains  Swelling of the abdomen that is new, acute  Fever of 100F or higher  For urgent or emergent issues, a gastroenterologist can be reached at any hour by calling [phone]. Do not use MyChart messaging for urgent concerns.    DIET:  We do recommend a small meal at first, but then you may proceed  to your regular diet.  Drink plenty of fluids but you should avoid alcoholic beverages for 24 hours.  ACTIVITY:  You should plan to take it easy for the rest of today and you should NOT DRIVE or use heavy machinery until tomorrow (because of the sedation medicines used during the test).    FOLLOW UP: Our staff will call the number listed on your records the next business day following your procedure.  We will call around 7:15- 8:00 am to check on you and address any questions or concerns that you may have regarding the information given to you following your procedure. If we do not reach you, we will leave a message.     If any biopsies were taken you will be contacted by phone or by letter within the next 1-3 weeks.  Please call us  at [phone] if you have not heard about the biopsies in 3 weeks.    SIGNATURES/CONFIDENTIALITY: You and/or your care partner have signed paperwork which will be entered into your electronic medical record.  These signatures attest to the fact that that the information above on your After Visit Summary has been reviewed and is understood.  Full responsibility of the confidentiality of this discharge information lies with you and/or your care-partner.

## 2024-03-27 NOTE — Progress Notes (Signed)
 GASTROENTEROLOGY PROCEDURE H&P NOTE   Primary Care Physician: Lendia Boby CROME, NP-C    Reason for Procedure:  Colon Cancer screening  Plan:    Colonoscopy  Patient is appropriate for endoscopic procedure(s) in the ambulatory ([facility]) setting.  The nature of the procedure, as well as the risks, benefits, and alternatives were carefully and thoroughly reviewed with the patient. Ample time for discussion and questions allowed. The patient understood, was satisfied, and agreed to proceed.     HPI: Earl Harvey is a 68 y.o. male who presents for colonoscopy for routine Colon Cancer screening.  No active GI symptoms.  No known family history of colon cancer or related malignancy.  Patient is otherwise without complaints or active issues today.  Past Medical History:  Diagnosis Date   Arthritis    Asthma    Cataract Dec.2024   Clotting disorder (HCC)    Diabetes mellitus    Diverticulitis    Glaucoma    Gout    Hypercholesteremia    Hypertension    Kidney disease    Left foot pain 05/29/2022   Rash 07/30/2022   Thyroid  disease     Past Surgical History:  Procedure Laterality Date   CERVICAL DISCECTOMY     EYE SURGERY     JOINT REPLACEMENT  1995    Prior to Admission medications   Medication Sig Start Date End Date Taking? Authorizing Provider  allopurinol  (ZYLOPRIM ) 300 MG tablet Take 1 tablet (300 mg total) by mouth every other day. 07/28/23  Yes Henson, Vickie L, NP-C  Alpha Lipoic Acid 200 MG CAPS Take 200 mg by mouth 2 (two) times daily.   Yes [provider]  ascorbic acid (VITAMIN C) 500 MG tablet Take 1,000 mg by mouth daily.   Yes [provider]  aspirin EC 81 MG tablet Take 81 mg by mouth daily. Swallow whole.   Yes [provider]  Blood Glucose Monitoring Suppl DEVI Use to check blood sugars. May substitute to any manufacturer covered by patient's insurance. 07/28/23  Yes Henson, Vickie L, NP-C  chlorthalidone  (HYGROTON ) 25 MG  tablet Take 1 tablet (25 mg total) by mouth daily. 02/23/24  Yes Henson, Vickie L, NP-C  Cholecalciferol (VITAMIN D3) 125 MCG (5000 UT) TABS Take 1 tablet by mouth daily.   Yes [provider]  co-enzyme Q-10 30 MG capsule Take 30 mg by mouth daily.   Yes [provider]  Continuous Glucose Sensor (DEXCOM G7 SENSOR) MISC 1 Device by Does not apply route continuous. 11/18/23  Yes Motwani, Komal, MD  dapagliflozin  propanediol (FARXIGA ) 5 MG TABS tablet TAKE 1 TABLET BY MOUTH ONCE DAILY BEFORE BREAKFAST 03/21/24  Yes Motwani, Komal, MD  ezetimibe  (ZETIA ) 10 MG tablet Take 1 tablet by mouth once daily 01/11/24  Yes Henson, Vickie L, NP-C  fish oil-omega-3 fatty acids 1000 MG capsule Take 2 g by mouth daily.     Yes [provider]  fluorometholone (FML) 0.1 % ophthalmic suspension Place 1 drop into the left eye 4 (four) times daily. 03/20/24  Yes [provider]  Garlic 500 MG TABS Take 1 tablet by mouth daily.     Yes [provider]  Ginseng 250 MG CAPS Take 500 mg by mouth at bedtime.   Yes [provider]  glimepiride  (AMARYL ) 2 MG tablet Take 2 mg by mouth 2 (two) times daily before a meal.   Yes [provider]  Glucose Blood (BLOOD GLUCOSE TEST STRIPS) STRP Use  to check blood sugars 2 times daily. May substitute to any manufacturer covered by patient's insurance. 07/28/23  Yes Henson, Vickie L, NP-C  insulin  regular human CONCENTRATED (HUMULIN  R U-500 KWIKPEN) 500 UNIT/ML KwikPen 75 units before break fast, 55 units before lunch and 55 units before supper - 30 min before meals 12/27/23  Yes Motwani, Komal, MD  ketorolac (ACULAR) 0.5 % ophthalmic solution Place 1 drop into the left eye 4 (four) times daily. 03/15/24  Yes [provider]  Lancet Device MISC Use to check blood sugars. May substitute to any manufacturer covered by patient's insurance. 07/28/23  Yes Henson, Vickie L, NP-C  Lancets Misc. MISC Use to check blood sugars 2 times  daily. May substitute to any manufacturer covered by patient's insurance. 07/28/23  Yes Henson, Vickie L, NP-C  levothyroxine  (SYNTHROID ) 125 MCG tablet Take 1 tablet (125 mcg total) by mouth every morning. 10/19/23  Yes Henson, Vickie L, NP-C  losartan  (COZAAR ) 100 MG tablet Take 1 tablet (100 mg total) by mouth daily. 02/24/24  Yes Henson, Vickie L, NP-C  Multiple Vitamins-Minerals (MULTIVITAMINS THER. W/MINERALS) TABS Take 1 tablet by mouth daily.     Yes [provider]  ofloxacin (OCUFLOX) 0.3 % ophthalmic solution Place 1 drop into the left eye 4 (four) times daily. 01/18/24  Yes [provider]  OVER THE COUNTER MEDICATION Berberine, one capsule daily.   Yes [provider]  prednisoLONE acetate (PRED FORTE) 1 % ophthalmic suspension Place 1 drop into the left eye 4 (four) times daily. 03/15/24  Yes [provider]  Probiotic Product (PROBIOTIC DAILY PO) Take by mouth daily.   Yes [provider]  Turmeric (QC TUMERIC COMPLEX PO) Take by mouth daily.   Yes [provider]  Glucagon  (BAQSIMI  ONE PACK) 3 MG/DOSE POWD Place 1 Device into the nose as needed (Low blood sugar with impaired consciousness). 11/18/23   Motwani, Komal, MD  hydrOXYzine  (ATARAX ) 25 MG tablet Take 1 tablet (25 mg total) by mouth every 6 (six) hours as needed for itching. Patient not taking: Reported on 03/13/2024 04/16/23   Dreama Longs, MD  ibuprofen  (ADVIL ,MOTRIN ) 800 MG tablet Take 1 tablet (800 mg total) by mouth 3 (three) times daily. 09/19/12   Odell Balls, PA-C  levocetirizine (XYZAL ) 5 MG tablet Take 1 tablet (5 mg total) by mouth every evening. Patient not taking: Reported on 03/13/2024 04/08/23   Joesph Annabella HERO, FNP  triamcinolone  ointment (KENALOG ) 0.5 % Apply 1 Application topically 2 (two) times daily. 01/06/23   Joesph Annabella HERO, FNP    Current Outpatient Medications  Medication Sig Dispense Refill   allopurinol  (ZYLOPRIM ) 300 MG tablet Take 1 tablet (300  mg total) by mouth every other day. 30 tablet 4   Alpha Lipoic Acid 200 MG CAPS Take 200 mg by mouth 2 (two) times daily.     ascorbic acid (VITAMIN C) 500 MG tablet Take 1,000 mg by mouth daily.     aspirin EC 81 MG tablet Take 81 mg by mouth daily. Swallow whole.     Blood Glucose Monitoring Suppl DEVI Use to check blood sugars. May substitute to any manufacturer covered by patient's insurance. 1 each 0   chlorthalidone  (HYGROTON ) 25 MG tablet Take 1 tablet (25 mg total) by mouth daily. 90 tablet 1   Cholecalciferol (VITAMIN D3) 125 MCG (5000 UT) TABS Take 1 tablet by mouth daily.     co-enzyme Q-10 30 MG capsule Take 30 mg by mouth daily.  Continuous Glucose Sensor (DEXCOM G7 SENSOR) MISC 1 Device by Does not apply route continuous. 9 each 3   dapagliflozin  propanediol (FARXIGA ) 5 MG TABS tablet TAKE 1 TABLET BY MOUTH ONCE DAILY BEFORE BREAKFAST 30 tablet 2   ezetimibe  (ZETIA ) 10 MG tablet Take 1 tablet by mouth once daily 90 tablet 0   fish oil-omega-3 fatty acids 1000 MG capsule Take 2 g by mouth daily.       fluorometholone (FML) 0.1 % ophthalmic suspension Place 1 drop into the left eye 4 (four) times daily.     Garlic 500 MG TABS Take 1 tablet by mouth daily.       Ginseng 250 MG CAPS Take 500 mg by mouth at bedtime.     glimepiride  (AMARYL ) 2 MG tablet Take 2 mg by mouth 2 (two) times daily before a meal.     Glucose Blood (BLOOD GLUCOSE TEST STRIPS) STRP Use to check blood sugars 2 times daily. May substitute to any manufacturer covered by patient's insurance. 200 strip 6   insulin  regular human CONCENTRATED (HUMULIN  R U-500 KWIKPEN) 500 UNIT/ML KwikPen 75 units before break fast, 55 units before lunch and 55 units before supper - 30 min before meals 39 mL 3   ketorolac (ACULAR) 0.5 % ophthalmic solution Place 1 drop into the left eye 4 (four) times daily.     Lancet Device MISC Use to check blood sugars. May substitute to any manufacturer covered by patient's insurance. 1 each 0    Lancets Misc. MISC Use to check blood sugars 2 times daily. May substitute to any manufacturer covered by patient's insurance. 200 each 6   levothyroxine  (SYNTHROID ) 125 MCG tablet Take 1 tablet (125 mcg total) by mouth every morning. 90 tablet 1   losartan  (COZAAR ) 100 MG tablet Take 1 tablet (100 mg total) by mouth daily. 90 tablet 0   Multiple Vitamins-Minerals (MULTIVITAMINS THER. W/MINERALS) TABS Take 1 tablet by mouth daily.       ofloxacin (OCUFLOX) 0.3 % ophthalmic solution Place 1 drop into the left eye 4 (four) times daily.     OVER THE COUNTER MEDICATION Berberine, one capsule daily.     prednisoLONE acetate (PRED FORTE) 1 % ophthalmic suspension Place 1 drop into the left eye 4 (four) times daily.     Probiotic Product (PROBIOTIC DAILY PO) Take by mouth daily.     Turmeric (QC TUMERIC COMPLEX PO) Take by mouth daily.     Glucagon  (BAQSIMI  ONE PACK) 3 MG/DOSE POWD Place 1 Device into the nose as needed (Low blood sugar with impaired consciousness). 2 each 3   hydrOXYzine  (ATARAX ) 25 MG tablet Take 1 tablet (25 mg total) by mouth every 6 (six) hours as needed for itching. (Patient not taking: Reported on 03/13/2024) 12 tablet 0   ibuprofen  (ADVIL ,MOTRIN ) 800 MG tablet Take 1 tablet (800 mg total) by mouth 3 (three) times daily. 21 tablet 0   levocetirizine (XYZAL ) 5 MG tablet Take 1 tablet (5 mg total) by mouth every evening. (Patient not taking: Reported on 03/13/2024) 90 tablet 3   triamcinolone  ointment (KENALOG ) 0.5 % Apply 1 Application topically 2 (two) times daily. 30 g 0   Current Facility-Administered Medications  Medication Dose Route Frequency Provider Last Rate Last Admin   0.9 %  sodium chloride  infusion  500 mL Intravenous Once Callan Norden V, DO        Allergies as of 03/27/2024   (No Known Allergies)    Family History  Problem Relation  Age of Onset   Hyperlipidemia Mother    COPD Mother    Hypertension Father    Hyperlipidemia Father    Arthritis Father     Alcohol abuse Father    Stroke Father    Heart failure Father    Heart disease Father    Hypertension Sister    Hyperlipidemia Sister    Hypertension Brother    Hyperlipidemia Brother    Depression Brother    Cancer Brother        lung   Arthritis Brother    Anxiety disorder Brother    Alcohol abuse Brother    Cancer Brother    Epilepsy Daughter    Colon cancer Neg Hx    Esophageal cancer Neg Hx    Stomach cancer Neg Hx    Rectal cancer Neg Hx     Social History   Socioeconomic History   Marital status: Divorced    Spouse name: Not on file   Number of children: 1   Years of education: 13   Highest education level: 12th grade  Occupational History   Occupation: RETIRED  Tobacco Use   Smoking status: Former    Current packs/day: 0.00    Types: Cigarettes    Quit date: 05/07/1991    Years since quitting: 32.9   Smokeless tobacco: Never  Vaping Use   Vaping status: Never Used  Substance and Sexual Activity   Alcohol use: Not Currently    Comment: Rarely   Drug use: No   Sexual activity: Not Currently  Other Topics Concern   Not on file  Social History Narrative   Has a roommate/2025   Social Drivers of Health   Financial Resource Strain: High Risk (02/01/2024)   Overall Financial Resource Strain (CARDIA)    Difficulty of Paying Living Expenses: Hard  Food Insecurity: No Food Insecurity (02/01/2024)   Hunger Vital Sign    Worried About Running Out of Food in the Last Year: Never true    Ran Out of Food in the Last Year: Never true  Transportation Needs: No Transportation Needs (02/01/2024)   PRAPARE - Administrator, Civil Service (Medical): No    Lack of Transportation (Non-Medical): No  Physical Activity: Sufficiently Active (02/01/2024)   Exercise Vital Sign    Days of Exercise per Week: 4 days    Minutes of Exercise per Session: 40 min  Stress: No Stress Concern Present (02/01/2024)   [employer] of Occupational Health - Occupational  Stress Questionnaire    Feeling of Stress: Not at all  Social Connections: Moderately Isolated (02/01/2024)   Social Connection and Isolation Panel    Frequency of Communication with Friends and Family: More than three times a week    Frequency of Social Gatherings with Friends and Family: Once a week    Attends Religious Services: 1 to 4 times per year    Active Member of [employer] or Organizations: No    Attends Engineer, structural: Not on file    Marital Status: Divorced  Intimate Partner Violence: Patient Unable To Answer (02/01/2024)   Humiliation, Afraid, Rape, and Kick questionnaire    Fear of Current or Ex-Partner: Patient unable to answer    Emotionally Abused: Patient unable to answer    Physically Abused: Patient unable to answer    Sexually Abused: Patient unable to answer    Physical Exam: Vital signs in last 24 hours: @BP  134/74   Pulse 70   Temp 98.4 F (  36.9 C) (Temporal)   Ht 5' 8 (1.727 m)   Wt 230 lb (104.3 kg)   SpO2 98%   BMI 34.97 kg/m  GEN: NAD EYE: Sclerae anicteric ENT: MMM CV: Non-tachycardic Pulm: CTA b/l GI: Soft, NT/ND NEURO:  Alert & Oriented x 3   Sandor Flatter, DO [facility]   03/27/2024 1:11 PM

## 2024-03-27 NOTE — Op Note (Signed)
 [facility] Patient Name: Earl Harvey Procedure Date: 03/27/2024 1:04 PM MRN: [id number] Endoscopist: Sandor Flatter , MD, [id number] Age: 68 Referring MD:  Date of Birth: 1956/07/03 Gender: Male Account #: [id number] Procedure:                Colonoscopy Indications:              Screening for colorectal malignant neoplasm (last                            colonoscopy was 10 years ago) Medicines:                Monitored Anesthesia Care Procedure:                Pre-Anesthesia Assessment:                           - Prior to the procedure, a History and Physical                            was performed, and patient medications and                            allergies were reviewed. The patient's tolerance of                            previous anesthesia was also reviewed. The risks                            and benefits of the procedure and the sedation                            options and risks were discussed with the patient.                            All questions were answered, and informed consent                            was obtained. Prior Anticoagulants: The patient has                            taken no anticoagulant or antiplatelet agents. ASA                            Grade Assessment: III - A patient with severe                            systemic disease. After reviewing the risks and                            benefits, the patient was deemed in satisfactory                            condition to undergo the procedure.  After obtaining informed consent, the colonoscope                            was passed under direct vision. Throughout the                            procedure, the patient's blood pressure, pulse, and                            oxygen saturations were monitored continuously. The                            CF [id number] #[id number] was introduced through the anus                            and advanced to the the  cecum, identified by                            appendiceal orifice and ileocecal valve. The                            colonoscopy was performed without difficulty. The                            patient tolerated the procedure well. The quality                            of the bowel preparation was good. The ileocecal                            valve, appendiceal orifice, and rectum were                            photographed. Scope In: 1:35:31 PM Scope Out: 1:54:26 PM Scope Withdrawal Time: 0 hours 15 minutes 46 seconds  Total Procedure Duration: 0 hours 18 minutes 55 seconds  Findings:                 The perianal and digital rectal examinations were                            normal.                           Two sessile polyps were found in the ascending                            colon. The polyps were 3 to 4 mm in size. These                            polyps were removed with a cold snare. Resection                            and retrieval were complete. Estimated blood loss  was minimal.                           Four sessile polyps were found in the sigmoid colon                            (1) and descending colon (3). The polyps were 3 to                            5 mm in size. These polyps were removed with a cold                            snare. Resection and retrieval were complete.                            Estimated blood loss was minimal.                           Multiple medium-mouthed and small-mouthed                            diverticula were found in the sigmoid colon,                            ascending colon and cecum.                           Non-bleeding internal hemorrhoids were found during                            retroflexion. The hemorrhoids were small. Complications:            No immediate complications. Estimated Blood Loss:     Estimated blood loss was minimal. Impression:               - Two 3 to 4 mm polyps in  the ascending colon,                            removed with a cold snare. Resected and retrieved.                           - Four 3 to 5 mm polyps in the sigmoid colon and in                            the descending colon, removed with a cold snare.                            Resected and retrieved.                           - Diverticulosis in the sigmoid colon, in the                            ascending colon and in the cecum.                           -  Non-bleeding internal hemorrhoids. Recommendation:           - Patient has a contact number available for                            emergencies. The signs and symptoms of potential                            delayed complications were discussed with the                            patient. Return to normal activities tomorrow.                            Written discharge instructions were provided to the                            patient.                           - Resume previous diet.                           - Continue present medications.                           - Await pathology results.                           - Repeat colonoscopy for surveillance based on                            pathology results.                           - Return to GI clinic PRN. Sandor Flatter, MD 03/27/2024 1:59:49 PM

## 2024-03-27 NOTE — Progress Notes (Signed)
 To pacu, VSS. Report to [email or web address]

## 2024-03-27 NOTE — Progress Notes (Signed)
 Pt's states no medical or surgical changes since previsit or office visit.

## 2024-03-28 ENCOUNTER — Telehealth: Payer: Self-pay

## 2024-03-28 NOTE — Telephone Encounter (Signed)
  Follow up Call-     03/27/2024   12:39 PM  Call back number  Post procedure Call Back phone  # [phone]  Permission to leave phone message Yes     Patient questions:  Do you have a fever, pain , or abdominal swelling? No. Pain Score  0 *  Have you tolerated food without any problems? Yes.    Have you been able to return to your normal activities? Yes.    Do you have any questions about your discharge instructions: Diet   No. Medications  No. Follow up visit  No.  Do you have questions or concerns about your Care? No.  Actions: * If pain score is 4 or above: No action needed, pain <4.

## 2024-03-30 LAB — SURGICAL PATHOLOGY

## 2024-03-31 ENCOUNTER — Ambulatory Visit: Payer: Self-pay | Admitting: Gastroenterology

## 2024-03-31 ENCOUNTER — Telehealth: Payer: Self-pay | Admitting: Family Medicine

## 2024-03-31 NOTE — Telephone Encounter (Signed)
 Pt has CPE scheduled 12/17 and lab appt scheduled 12/15.  Please enter lab orders for this visit if appropriate.

## 2024-04-03 ENCOUNTER — Encounter (INDEPENDENT_AMBULATORY_CARE_PROVIDER_SITE_OTHER): Admitting: Ophthalmology

## 2024-04-03 DIAGNOSIS — E113312 Type 2 diabetes mellitus with moderate nonproliferative diabetic retinopathy with macular edema, left eye: Secondary | ICD-10-CM | POA: Diagnosis not present

## 2024-04-03 DIAGNOSIS — Z794 Long term (current) use of insulin: Secondary | ICD-10-CM | POA: Diagnosis not present

## 2024-04-03 DIAGNOSIS — Z7984 Long term (current) use of oral hypoglycemic drugs: Secondary | ICD-10-CM

## 2024-04-03 NOTE — Telephone Encounter (Signed)
 Called and notified pt

## 2024-04-04 ENCOUNTER — Ambulatory Visit (INDEPENDENT_AMBULATORY_CARE_PROVIDER_SITE_OTHER): Admitting: Family Medicine

## 2024-04-04 ENCOUNTER — Ambulatory Visit: Payer: Self-pay | Admitting: Family Medicine

## 2024-04-04 ENCOUNTER — Encounter: Payer: Self-pay | Admitting: Family Medicine

## 2024-04-04 VITALS — BP 126/84 | HR 67 | Temp 97.6°F | Ht 68.0 in | Wt 233.0 lb

## 2024-04-04 DIAGNOSIS — R7401 Elevation of levels of liver transaminase levels: Secondary | ICD-10-CM

## 2024-04-04 DIAGNOSIS — E66811 Obesity, class 1: Secondary | ICD-10-CM

## 2024-04-04 DIAGNOSIS — R809 Proteinuria, unspecified: Secondary | ICD-10-CM | POA: Diagnosis not present

## 2024-04-04 DIAGNOSIS — E1169 Type 2 diabetes mellitus with other specified complication: Secondary | ICD-10-CM

## 2024-04-04 DIAGNOSIS — R972 Elevated prostate specific antigen [PSA]: Secondary | ICD-10-CM

## 2024-04-04 DIAGNOSIS — E1129 Type 2 diabetes mellitus with other diabetic kidney complication: Secondary | ICD-10-CM

## 2024-04-04 DIAGNOSIS — E785 Hyperlipidemia, unspecified: Secondary | ICD-10-CM

## 2024-04-04 DIAGNOSIS — Z789 Other specified health status: Secondary | ICD-10-CM | POA: Insufficient documentation

## 2024-04-04 DIAGNOSIS — Z6835 Body mass index (BMI) 35.0-35.9, adult: Secondary | ICD-10-CM

## 2024-04-04 DIAGNOSIS — E113592 Type 2 diabetes mellitus with proliferative diabetic retinopathy without macular edema, left eye: Secondary | ICD-10-CM | POA: Diagnosis not present

## 2024-04-04 LAB — HEPATIC FUNCTION PANEL
ALT: 46 U/L (ref 0–53)
AST: 33 U/L (ref 0–37)
Albumin: 4.5 g/dL (ref 3.5–5.2)
Alkaline Phosphatase: 54 U/L (ref 39–117)
Bilirubin, Direct: 0.1 mg/dL (ref 0.0–0.3)
Total Bilirubin: 0.5 mg/dL (ref 0.2–1.2)
Total Protein: 7.7 g/dL (ref 6.0–8.3)

## 2024-04-04 LAB — BASIC METABOLIC PANEL WITH GFR
BUN: 23 mg/dL (ref 6–23)
CO2: 30 meq/L (ref 19–32)
Calcium: 9.8 mg/dL (ref 8.4–10.5)
Chloride: 100 meq/L (ref 96–112)
Creatinine, Ser: 1.14 mg/dL (ref 0.40–1.50)
GFR: 66.83 mL/min (ref >60.00)
Glucose, Bld: 190 mg/dL — ABNORMAL HIGH (ref 70–99)
Potassium: 4.1 meq/L (ref 3.5–5.1)
Sodium: 138 meq/L (ref 135–145)

## 2024-04-04 MED ORDER — REPATHA SURECLICK 140 MG/ML ~~LOC~~ SOAJ: 140.0000 mg | SUBCUTANEOUS | 2 refills | Status: AC

## 2024-04-04 NOTE — Progress Notes (Signed)
 Subjective:     Patient ID: Earl Harvey, male    DOB: 02-26-1956, 68 y.o.   MRN: [id number]  Chief Complaint  Patient presents with   Follow-up    3 week f/u, urine has protein to increased losartan  to 100 mg, not doing anymore as it increased his fatigue. Not interested in statin  FASTING    HPI  Discussed the use of AI scribe software for clinical note transcription with the patient, who gave verbal consent to proceed.  History of Present Illness Earl Harvey is a 68 year old male who presents for follow-up on chronic health conditions including urine microalbuminuria and uncontrolled hyperlipidemia.  Dyslipidemia and statin intolerance - Uncontrolled hyperlipidemia - Headaches and general malaise with statin therapy, including pravastatin  and another unnamed statin  Hypertension and antihypertensive side effects - Currently on losartan  50 mg - Extreme fatigue with losartan  100 mg, leading to dose adjustment  Type 2 diabetes mellitus and medication side effects - Recent A1c of 7.1, showing some improvement in glycemic control - Takes Farxiga , which causes recurrent yeast infections - Previously experienced similar yeast infections with Jardiance   Chronic kidney disease - Stage 3 chronic kidney disease - Urine microalbuminuria present - No nephrology evaluation to date  Cardiovascular symptoms and findings - No chest pain, shortness of breath, or palpitations - EKG in August 2024 showed first-degree AV block - Abdominal ultrasound screening for aneurysm was negative  Liver function abnormality - Mildly elevated liver test result - No alcohol consumption     There are no preventive care reminders to display for this patient.  Past Medical History:  Diagnosis Date   Arthritis    Asthma    Cataract Dec.2024   Clotting disorder    Diabetes mellitus    Diverticulitis    Glaucoma    Gout    Hypercholesteremia    Hypertension    Kidney disease    Left  foot pain 05/29/2022   Rash 07/30/2022   Thyroid  disease     Past Surgical History:  Procedure Laterality Date   CERVICAL DISCECTOMY     EYE SURGERY     JOINT REPLACEMENT  1995    Family History  Problem Relation Age of Onset   Hyperlipidemia Mother    COPD Mother    Hypertension Father    Hyperlipidemia Father    Arthritis Father    Alcohol abuse Father    Stroke Father    Heart failure Father    Heart disease Father    Hypertension Sister    Hyperlipidemia Sister    Hypertension Brother    Hyperlipidemia Brother    Depression Brother    Cancer Brother        lung   Arthritis Brother    Anxiety disorder Brother    Alcohol abuse Brother    Cancer Brother    Epilepsy Daughter    Colon cancer Neg Hx    Esophageal cancer Neg Hx    Stomach cancer Neg Hx    Rectal cancer Neg Hx     Social History   Socioeconomic History   Marital status: Divorced    Spouse name: Not on file   Number of children: 1   Years of education: 13   Highest education level: 12th grade  Occupational History   Occupation: RETIRED  Tobacco Use   Smoking status: Former    Current packs/day: 0.00    Types: Cigarettes    Quit date: 05/07/1991  Years since quitting: 32.9   Smokeless tobacco: Never  Vaping Use   Vaping status: Never Used  Substance and Sexual Activity   Alcohol use: Not Currently    Comment: Rarely   Drug use: No   Sexual activity: Not Currently  Other Topics Concern   Not on file  Social History Narrative   Has a roommate/2025   Social Drivers of Health   Financial Resource Strain: High Risk (02/01/2024)   Overall Financial Resource Strain (CARDIA)    Difficulty of Paying Living Expenses: Hard  Food Insecurity: No Food Insecurity (02/01/2024)   Hunger Vital Sign    Worried About Running Out of Food in the Last Year: Never true    Ran Out of Food in the Last Year: Never true  Transportation Needs: No Transportation Needs (02/01/2024)   PRAPARE - Therapist, art (Medical): No    Lack of Transportation (Non-Medical): No  Physical Activity: Sufficiently Active (02/01/2024)   Exercise Vital Sign    Days of Exercise per Week: 4 days    Minutes of Exercise per Session: 40 min  Stress: No Stress Concern Present (02/01/2024)   [employer] of Occupational Health - Occupational Stress Questionnaire    Feeling of Stress: Not at all  Social Connections: Moderately Isolated (02/01/2024)   Social Connection and Isolation Panel    Frequency of Communication with Friends and Family: More than three times a week    Frequency of Social Gatherings with Friends and Family: Once a week    Attends Religious Services: 1 to 4 times per year    Active Member of [employer] or Organizations: No    Attends Engineer, structural: Not on file    Marital Status: Divorced  Intimate Partner Violence: Patient Unable To Answer (02/01/2024)   Humiliation, Afraid, Rape, and Kick questionnaire    Fear of Current or Ex-Partner: Patient unable to answer    Emotionally Abused: Patient unable to answer    Physically Abused: Patient unable to answer    Sexually Abused: Patient unable to answer    Outpatient Medications Prior to Visit  Medication Sig Dispense Refill   allopurinol  (ZYLOPRIM ) 300 MG tablet Take 1 tablet (300 mg total) by mouth every other day. 30 tablet 4   Alpha Lipoic Acid 200 MG CAPS Take 200 mg by mouth 2 (two) times daily.     ascorbic acid (VITAMIN C) 500 MG tablet Take 1,000 mg by mouth daily.     aspirin EC 81 MG tablet Take 81 mg by mouth daily. Swallow whole.     Blood Glucose Monitoring Suppl DEVI Use to check blood sugars. May substitute to any manufacturer covered by patient's insurance. 1 each 0   chlorthalidone  (HYGROTON ) 25 MG tablet Take 1 tablet (25 mg total) by mouth daily. 90 tablet 1   Cholecalciferol (VITAMIN D3) 125 MCG (5000 UT) TABS Take 1 tablet by mouth daily.     co-enzyme Q-10 30 MG capsule Take 30 mg by  mouth daily.     Continuous Glucose Sensor (DEXCOM G7 SENSOR) MISC 1 Device by Does not apply route continuous. 9 each 3   dapagliflozin  propanediol (FARXIGA ) 5 MG TABS tablet TAKE 1 TABLET BY MOUTH ONCE DAILY BEFORE BREAKFAST 30 tablet 2   ezetimibe  (ZETIA ) 10 MG tablet Take 1 tablet by mouth once daily 90 tablet 0   fish oil-omega-3 fatty acids 1000 MG capsule Take 2 g by mouth daily.  fluorometholone (FML) 0.1 % ophthalmic suspension Place 1 drop into the left eye 4 (four) times daily.     Garlic 500 MG TABS Take 1 tablet by mouth daily.       Ginseng 250 MG CAPS Take 500 mg by mouth at bedtime.     glimepiride  (AMARYL ) 2 MG tablet Take 2 mg by mouth 2 (two) times daily before a meal.     Glucagon  (BAQSIMI  ONE PACK) 3 MG/DOSE POWD Place 1 Device into the nose as needed (Low blood sugar with impaired consciousness). 2 each 3   Glucose Blood (BLOOD GLUCOSE TEST STRIPS) STRP Use to check blood sugars 2 times daily. May substitute to any manufacturer covered by patient's insurance. 200 strip 6   hydrOXYzine  (ATARAX ) 25 MG tablet Take 1 tablet (25 mg total) by mouth every 6 (six) hours as needed for itching. 12 tablet 0   ibuprofen  (ADVIL ,MOTRIN ) 800 MG tablet Take 1 tablet (800 mg total) by mouth 3 (three) times daily. 21 tablet 0   insulin  regular human CONCENTRATED (HUMULIN  R U-500 KWIKPEN) 500 UNIT/ML KwikPen 75 units before break fast, 55 units before lunch and 55 units before supper - 30 min before meals 39 mL 3   ketorolac (ACULAR) 0.5 % ophthalmic solution Place 1 drop into the left eye 4 (four) times daily.     Lancet Device MISC Use to check blood sugars. May substitute to any manufacturer covered by patient's insurance. 1 each 0   Lancets Misc. MISC Use to check blood sugars 2 times daily. May substitute to any manufacturer covered by patient's insurance. 200 each 6   levocetirizine (XYZAL ) 5 MG tablet Take 1 tablet (5 mg total) by mouth every evening. 90 tablet 3   levothyroxine   (SYNTHROID ) 125 MCG tablet Take 1 tablet (125 mcg total) by mouth every morning. 90 tablet 1   losartan  (COZAAR ) 100 MG tablet Take 1 tablet (100 mg total) by mouth daily. (Patient taking differently: Take 50 mg by mouth daily.) 90 tablet 0   Multiple Vitamins-Minerals (MULTIVITAMINS THER. W/MINERALS) TABS Take 1 tablet by mouth daily.       ofloxacin (OCUFLOX) 0.3 % ophthalmic solution Place 1 drop into the left eye 4 (four) times daily.     OVER THE COUNTER MEDICATION Berberine, one capsule daily.     prednisoLONE acetate (PRED FORTE) 1 % ophthalmic suspension Place 1 drop into the left eye 4 (four) times daily.     Probiotic Product (PROBIOTIC DAILY PO) Take by mouth daily.     triamcinolone  ointment (KENALOG ) 0.5 % Apply 1 Application topically 2 (two) times daily. 30 g 0   Turmeric (QC TUMERIC COMPLEX PO) Take by mouth daily.     No facility-administered medications prior to visit.    No Known Allergies  Review of Systems  Constitutional:  Negative for chills, fever and malaise/fatigue.  Respiratory:  Negative for shortness of breath.   Cardiovascular:  Negative for chest pain, palpitations and leg swelling.  Gastrointestinal:  Negative for abdominal pain, constipation, diarrhea, nausea and vomiting.  Genitourinary:  Negative for dysuria, frequency and urgency.  Neurological:  Negative for dizziness, focal weakness and headaches.       Objective:    Physical Exam Constitutional:      General: He is not in acute distress.    Appearance: He is not ill-appearing.  Eyes:     Extraocular Movements: Extraocular movements intact.     Conjunctiva/sclera: Conjunctivae normal.  Cardiovascular:     Rate  and Rhythm: Normal rate.  Pulmonary:     Effort: Pulmonary effort is normal.  Musculoskeletal:     Cervical back: Normal range of motion and neck supple.     Right lower leg: No edema.     Left lower leg: No edema.  Skin:    General: Skin is warm and dry.  Neurological:      General: No focal deficit present.     Mental Status: He is alert and oriented to person, place, and time.     Cranial Nerves: No cranial nerve deficit.     Motor: No weakness.     Coordination: Coordination normal.  Psychiatric:        Mood and Affect: Mood normal.        Behavior: Behavior normal.        Thought Content: Thought content normal.      BP 126/84   Pulse 67   Temp 97.6 F (36.4 C) (Temporal)   Ht 5' 8 (1.727 m)   Wt 233 lb (105.7 kg)   SpO2 99%   BMI 35.43 kg/m  Wt Readings from Last 3 Encounters:  04/04/24 233 lb (105.7 kg)  03/27/24 230 lb (104.3 kg)  03/13/24 230 lb (104.3 kg)       Assessment & Plan:   Problem List Items Addressed This Visit     DM (diabetes mellitus) (HCC) - Primary   Relevant Orders   Basic metabolic panel with GFR (Completed)   Hyperlipidemia associated with type 2 diabetes mellitus (HCC)   Relevant Medications   Evolocumab (REPATHA SURECLICK) 140 MG/ML SOAJ   Other Relevant Orders   Lipoprotein A (LPA)   Obesity (BMI 30.0-34.9)   Statin intolerance   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 He has tried multiple statins and has not tolerated any of them. Repatha (evolocumab) injections were discussed as an alternative, which he is open to trying pending insurance approval. - Consider Repatha injections pending insurance approval - Check  lipoprotein A level to assess cardiovascular risk  Elevated liver enzymes Mild elevation in liver function tests with no alcohol consumption, suggesting dietary or other factors. - Recheck liver function tests  Type two diabetes with renal manifestations, diabetic microalbuminuria He has type two diabetes with renal manifestations, including diabetic microalbuminuria.  He is on Farxiga  and Losartan  50 mg (attempted to increase to 100 mg but he felt tired and reduced to 50 mg).  - Recheck kidney function - Consider referral to nephrology if kidney function worsens -continue follow up with endocrinology   Declines vaccines    I am having Earl Harvey start on [employer]. I am also having him maintain his fish oil-omega-3 fatty acids, Garlic, multivitamins ther. w/minerals, ibuprofen , Turmeric (QC TUMERIC COMPLEX PO), Vitamin D3, aspirin EC, triamcinolone  ointment, levocetirizine, hydrOXYzine , Blood Glucose Monitoring Suppl, BLOOD GLUCOSE TEST STRIPS,  Lancet Device, Lancets Misc., allopurinol , levothyroxine , Dexcom G7 Sensor, Baqsimi  One Pack, HumuLIN  R U-500 KwikPen, ezetimibe , chlorthalidone , losartan , OVER THE COUNTER MEDICATION, Farxiga , fluorometholone, ketorolac, ofloxacin, prednisoLONE acetate, glimepiride , Ginseng, ascorbic acid, co-enzyme Q-10, Alpha Lipoic Acid, and Probiotic Product (PROBIOTIC DAILY PO).  Meds ordered this encounter  Medications   Evolocumab (REPATHA SURECLICK) 140 MG/ML SOAJ    Sig: Inject 140 mg into the skin every 14 (fourteen) days.    Dispense:  2 mL    Refill:  2    Supervising Provider:   ROLLENE NORRIS A [[id number]]

## 2024-04-08 LAB — LIPOPROTEIN A (LPA): Lipoprotein (a): 52 nmol/L (ref <75)

## 2024-04-10 NOTE — Progress Notes (Signed)
 His lipoprotein (a) test is negative which is good. I am still waiting on his PSA result

## 2024-04-11 ENCOUNTER — Other Ambulatory Visit: Payer: Self-pay | Admitting: Family Medicine

## 2024-04-19 ENCOUNTER — Other Ambulatory Visit: Payer: Self-pay | Admitting: Family Medicine

## 2024-05-01 ENCOUNTER — Encounter (INDEPENDENT_AMBULATORY_CARE_PROVIDER_SITE_OTHER): Admitting: Ophthalmology

## 2024-05-01 DIAGNOSIS — Z7984 Long term (current) use of oral hypoglycemic drugs: Secondary | ICD-10-CM | POA: Diagnosis not present

## 2024-05-01 DIAGNOSIS — H35352 Cystoid macular degeneration, left eye: Secondary | ICD-10-CM | POA: Diagnosis not present

## 2024-05-01 DIAGNOSIS — H35033 Hypertensive retinopathy, bilateral: Secondary | ICD-10-CM | POA: Diagnosis not present

## 2024-05-01 DIAGNOSIS — Z794 Long term (current) use of insulin: Secondary | ICD-10-CM | POA: Diagnosis not present

## 2024-05-01 DIAGNOSIS — H43813 Vitreous degeneration, bilateral: Secondary | ICD-10-CM | POA: Diagnosis not present

## 2024-05-01 DIAGNOSIS — E113312 Type 2 diabetes mellitus with moderate nonproliferative diabetic retinopathy with macular edema, left eye: Secondary | ICD-10-CM

## 2024-05-01 DIAGNOSIS — I10 Essential (primary) hypertension: Secondary | ICD-10-CM

## 2024-05-21 ENCOUNTER — Other Ambulatory Visit: Payer: Self-pay | Admitting: Family Medicine

## 2024-05-25 ENCOUNTER — Encounter (INDEPENDENT_AMBULATORY_CARE_PROVIDER_SITE_OTHER): Admitting: Ophthalmology

## 2024-05-25 DIAGNOSIS — Z794 Long term (current) use of insulin: Secondary | ICD-10-CM

## 2024-05-25 DIAGNOSIS — H43812 Vitreous degeneration, left eye: Secondary | ICD-10-CM

## 2024-05-25 DIAGNOSIS — Z7984 Long term (current) use of oral hypoglycemic drugs: Secondary | ICD-10-CM

## 2024-05-25 DIAGNOSIS — H35032 Hypertensive retinopathy, left eye: Secondary | ICD-10-CM

## 2024-05-25 DIAGNOSIS — E113512 Type 2 diabetes mellitus with proliferative diabetic retinopathy with macular edema, left eye: Secondary | ICD-10-CM

## 2024-05-25 DIAGNOSIS — H59032 Cystoid macular edema following cataract surgery, left eye: Secondary | ICD-10-CM

## 2024-05-25 DIAGNOSIS — I10 Essential (primary) hypertension: Secondary | ICD-10-CM

## 2024-05-26 ENCOUNTER — Telehealth: Payer: Self-pay

## 2024-05-26 NOTE — Telephone Encounter (Signed)
 Copied from CRM [id number]. Topic: Referral - Request for Referral >> May 26, 2024 11:53 AM Alexandria E wrote: Did the patient discuss referral with their provider in the last year? No (If No - schedule appointment) (If Yes - send message)  Appointment offered? Yes - patient has a physical on 12/16 and stated he will also discuss this referral with PCP at time of visit.  Type of order/referral and detailed reason for visit: Endocrinology  Preference of office, provider, location: Dr. Thresa at [place]. Patient is currently seeing Dr. Dartha, but is wanting to switch endocrinologists as his daughter see Dr. Thresa and he would like too as well.  If referral order, have you been seen by this specialty before? Yes (If Yes, this issue or another issue? When? Where?  Can we respond through MyChart? Yes

## 2024-05-26 NOTE — Telephone Encounter (Signed)
 Noted for upcoming appt

## 2024-06-05 ENCOUNTER — Ambulatory Visit: Admitting: "Endocrinology

## 2024-06-17 ENCOUNTER — Ambulatory Visit
Admission: EM | Admit: 2024-06-17 | Discharge: 2024-06-17 | Disposition: A | Discharge status: Home / Self Care | Attending: Family Medicine | Admitting: Family Medicine

## 2024-06-17 DIAGNOSIS — K0889 Other specified disorders of teeth and supporting structures: Secondary | ICD-10-CM | POA: Diagnosis not present

## 2024-06-17 MED ORDER — LIDOCAINE VISCOUS HCL 2 % MT SOLN: 10.0000 mL | OROMUCOSAL | 0 refills | Status: AC | PRN

## 2024-06-17 MED ORDER — AMOXICILLIN-POT CLAVULANATE 875-125 MG PO TABS: 1.0000 | ORAL_TABLET | Freq: Two times a day (BID) | ORAL | 0 refills | Status: AC

## 2024-06-17 MED ORDER — LIDOCAINE VISCOUS HCL 2 % MT SOLN: 10.0000 mL | OROMUCOSAL | 0 refills | Status: DC | PRN

## 2024-06-17 MED ORDER — AMOXICILLIN-POT CLAVULANATE 875-125 MG PO TABS: 1.0000 | ORAL_TABLET | Freq: Two times a day (BID) | ORAL | 0 refills | Status: DC

## 2024-06-17 NOTE — Discharge Instructions (Signed)
 Follow-up with dentist to soon as possible

## 2024-06-17 NOTE — ED Provider Notes (Signed)
 [facility]    CSN: [id number] Arrival date & time: 06/17/24  1036      History   Chief Complaint Chief Complaint  Patient presents with   Dental Pain    HPI Earl Harvey is a 68 y.o. male.   Patient presenting today with acute on chronic right sided dental pain, coordinating facial swelling.  Denies fever, chills, difficulty breathing or swallowing, discharge or bleeding in the mouth.  So far not trying anything over-the-counter for symptoms.  Notes he is waiting to get all of his teeth extracted.    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, without long-term current use of insulin  (HCC) 04/04/2024   Decreased sensation of lower extremity 10/26/2023   Uncontrolled type 2 diabetes mellitus with hyperglycemia (HCC) 07/29/2023   Chronic kidney disease due to type 2 diabetes mellitus (HCC) 07/29/2023   Obesity (BMI 30.0-34.9) 07/29/2023   Long term current use of oral hypoglycemic drug 04/08/2023   History of DVT of lower extremity 04/08/2023   Right bundle branch block 02/11/2023   Acquired hypothyroidism 01/04/2023   Stage 3a chronic kidney disease (HCC) 10/01/2022   Deep vein thrombosis (DVT) of proximal vein of left lower extremity (HCC) 10/01/2022   Gout    DM (diabetes mellitus) (HCC) 05/19/2011   Hypertension associated with type 2 diabetes mellitus (HCC) 05/19/2011   Visual impairment 05/19/2011   ERECTILE DYSFUNCTION, 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 Authorizing Provider  allopurinol  (ZYLOPRIM ) 300 MG tablet Take 1 tablet (300 mg total) by mouth every other day. 07/28/23  Yes Henson, Vickie L, NP-C  Alpha Lipoic Acid 200 MG CAPS Take 200 mg by mouth 2 (two) times daily.   Yes [provider]  ascorbic acid (VITAMIN C) 500 MG tablet Take 1,000 mg by mouth daily.   Yes [provider]  aspirin EC 81 MG tablet Take 81 mg by mouth daily. Swallow whole.   Yes [provider]  Blood Glucose Monitoring Suppl DEVI Use to check blood sugars. May substitute to any manufacturer covered by patient's insurance. 07/28/23  Yes Henson, Vickie L, NP-C  chlorthalidone  (HYGROTON ) 25 MG tablet Take 1 tablet (25 mg total) by mouth daily. 02/23/24  Yes Henson, Vickie L, NP-C  Cholecalciferol (VITAMIN D3) 125 MCG (5000 UT) TABS Take 1 tablet by mouth daily.   Yes [provider]  co-enzyme Q-10 30 MG capsule Take 30 mg by mouth daily.   Yes  [provider]  Continuous Glucose Sensor (DEXCOM G7 SENSOR) MISC 1 Device by Does not apply route continuous. 11/18/23  Yes Komal, Motwani, MD  dapagliflozin  propanediol (FARXIGA ) 5 MG TABS tablet TAKE 1 TABLET BY MOUTH ONCE DAILY BEFORE BREAKFAST 03/21/24  Yes Komal, Motwani, MD  Evolocumab  (REPATHA  SURECLICK) 140 MG/ML SOAJ Inject 140 mg into the skin every 14 (fourteen) days. 04/04/24  Yes Henson, Vickie L, NP-C  ezetimibe  (ZETIA ) 10 MG tablet Take 1 tablet by mouth once daily 04/11/24  Yes Henson, Vickie L, NP-C  fish oil-omega-3 fatty acids 1000 MG capsule Take 2 g by mouth daily.     Yes [provider]  fluorometholone (FML) 0.1 % ophthalmic suspension Place 1 drop into the left eye 4 (four) times daily. 03/20/24  Yes [provider]  Garlic 500 MG TABS Take 1 tablet by mouth daily.     Yes [provider]  Ginseng 250 MG CAPS Take 500 mg by mouth at bedtime.   Yes [provider]  glimepiride  (AMARYL ) 2 MG tablet Take 2 mg by mouth 2 (two) times daily before a meal.   Yes [provider]  Glucagon  (BAQSIMI  ONE PACK) 3 MG/DOSE POWD Place 1 Device into the nose as needed (Low blood sugar with impaired consciousness). 11/18/23  Yes Komal, Motwani, MD  Glucose Blood (BLOOD GLUCOSE TEST STRIPS) STRP Use to check blood sugars 2 times daily. May substitute to any manufacturer covered by patient's insurance. 07/28/23  Yes Henson, Vickie L, NP-C  hydrOXYzine  (ATARAX ) 25 MG tablet Take 1 tablet (25 mg total) by mouth every 6 (six) hours as needed for itching. 04/16/23  Yes Dreama Longs, MD  ibuprofen  (ADVIL ,MOTRIN ) 800 MG tablet Take 1 tablet (800 mg total) by mouth 3 (three) times daily. 09/19/12  Yes Odell Balls, PA-C  insulin  regular human CONCENTRATED (HUMULIN  R U-500 KWIKPEN) 500 UNIT/ML KwikPen 75 units before break fast, 55 units before lunch and 55 units before supper - 30 min before meals 12/27/23  Yes Komal, Motwani, MD  ketorolac (ACULAR) 0.5 % ophthalmic solution Place 1 drop into the left eye 4 (four) times daily. 03/15/24  Yes [provider]  Lancet Device MISC Use to check blood sugars. May substitute to any manufacturer covered by patient's insurance. 07/28/23  Yes Henson, Vickie L, NP-C  Lancets Misc. MISC Use to check blood sugars 2 times daily. May substitute to any manufacturer covered by patient's insurance. 07/28/23  Yes Henson, Vickie L, NP-C  levocetirizine (XYZAL ) 5 MG tablet Take 1 tablet (5 mg total) by mouth every evening. 04/08/23  Yes Joesph Annabella HERO, FNP  levothyroxine  (SYNTHROID ) 125 MCG tablet TAKE 1 TABLET BY MOUTH  IN THE MORNING 04/19/24  Yes Henson, Vickie L, NP-C  losartan  (COZAAR ) 100 MG tablet Take 1 tablet by mouth once daily 05/22/24  Yes Henson, Vickie L, NP-C  Multiple Vitamins-Minerals (MULTIVITAMINS THER. W/MINERALS) TABS Take 1 tablet by mouth daily.     Yes [provider]  ofloxacin (OCUFLOX) 0.3 % ophthalmic solution Place 1 drop into the left eye 4 (four) times daily. 01/18/24  Yes [provider]  OVER THE COUNTER MEDICATION Berberine, one capsule daily.   Yes [provider]  Probiotic Product (PROBIOTIC DAILY PO) Take by mouth daily.   Yes [provider]  triamcinolone  ointment (KENALOG ) 0.5 % Apply 1 Application topically 2 (two) times daily. 01/06/23  Yes Joesph Annabella HERO, FNP  Turmeric (QC TUMERIC COMPLEX PO) Take by mouth daily.  Yes [provider]  amoxicillin -clavulanate (AUGMENTIN ) 875-125 MG tablet Take 1 tablet by mouth every 12 (twelve) hours. 06/17/24   Stuart Vernell Norris, PA-C  lidocaine  (XYLOCAINE ) 2 % solution Use as directed 10 mLs in the mouth or throat every 3 (three) hours as needed. 06/17/24   Stuart Vernell Norris, PA-C  prednisoLONE acetate (PRED FORTE) 1 % ophthalmic suspension Place 1 drop into the left eye 4 (four) times daily. 03/15/24   [provider]    Family History Family History  Problem Relation Age of Onset   Hyperlipidemia Mother    COPD Mother    Hypertension Father    Hyperlipidemia Father    Arthritis Father    Alcohol abuse Father    Stroke Father    Heart failure Father    Heart disease Father    Hypertension Sister    Hyperlipidemia Sister    Hypertension Brother    Hyperlipidemia Brother    Depression Brother    Cancer Brother        lung   Arthritis Brother    Anxiety disorder Brother    Alcohol abuse Brother    Cancer Brother    Epilepsy Daughter    Colon cancer Neg Hx    Esophageal cancer Neg Hx    Stomach cancer Neg Hx    Rectal cancer Neg Hx     Social  History Social History[1]   Allergies   Patient has no known allergies.   Review of Systems Review of Systems Per HPI  Physical Exam Triage Vital Signs ED Triage Vitals  Encounter Vitals Group     BP 06/17/24 1052 126/71     Girls Systolic BP Percentile --      Girls Diastolic BP Percentile --      Boys Systolic BP Percentile --      Boys Diastolic BP Percentile --      Pulse Rate 06/17/24 1052 83     Resp 06/17/24 1052 17     Temp 06/17/24 1052 98 F (36.7 C)     Temp Source 06/17/24 1052 Oral     SpO2 06/17/24 1052 95 %     Weight 06/17/24 1051 230 lb (104.3 kg)     Height 06/17/24 1051 5' 8 (1.727 m)     Head Circumference --      Peak Flow --      Pain Score 06/17/24 1051 5     Pain Loc --      Pain Education --      Exclude from Growth Chart --    No data found.  Updated Vital Signs BP 126/71 (BP Location: Right Arm)   Pulse 83   Temp 98 F (36.7 C) (Oral)   Resp 17   Ht 5' 8 (1.727 m)   Wt 230 lb (104.3 kg)   SpO2 95%   BMI 34.97 kg/m   Visual Acuity Right Eye Distance:   Left Eye Distance:   Bilateral Distance:    Right Eye Near:   Left Eye Near:    Bilateral Near:     Physical Exam Vitals and nursing note reviewed.  Constitutional:      Appearance: Normal appearance.  HENT:     Head: Atraumatic.     Mouth/Throat:     Mouth: Mucous membranes are moist.     Comments: Significantly poor dentition, particularly right upper jaw with gingival erythema and edema, decaying teeth Eyes:     Extraocular Movements: Extraocular movements intact.  Conjunctiva/sclera: Conjunctivae normal.  Cardiovascular:     Rate and Rhythm: Normal rate.  Pulmonary:     Effort: Pulmonary effort is normal.  Musculoskeletal:        General: Normal range of motion.     Cervical back: Normal range of motion and neck supple.  Skin:    General: Skin is warm and dry.  Neurological:     Mental Status: He is oriented to person, place, and time.  Psychiatric:         Mood and Affect: Mood normal.        Thought Content: Thought content normal.        Judgment: Judgment normal.      UC Treatments / Results  Labs (all labs ordered are listed, but only abnormal results are displayed) Labs Reviewed - No data to display  EKG   Radiology No results found.  Procedures Procedures (including critical care time)  Medications Ordered in UC Medications - No data to display  Initial Impression / Assessment and Plan / UC Course  I have reviewed the triage vital signs and the nursing notes.  Pertinent labs & imaging results that were available during my care of the patient were reviewed by me and considered in my medical decision making (see chart for details).     Treat with Augmentin , viscous lidocaine , supportive over-the-counter medications and home care.  Return for worsening or unresolving symptoms.  Follow-up with dentist soon as possible.  Final Clinical Impressions(s) / UC Diagnoses   Final diagnoses:  Pain, dental     Discharge Instructions      Follow-up with dentist to soon as possible.    ED Prescriptions     Medication Sig Dispense Auth. Provider   amoxicillin -clavulanate (AUGMENTIN ) 875-125 MG tablet  (Status: Discontinued) Take 1 tablet by mouth every 12 (twelve) hours. 14 tablet Stuart Vernell Norris, PA-C   lidocaine  (XYLOCAINE ) 2 % solution  (Status: Discontinued) Use as directed 10 mLs in the mouth or throat every 3 (three) hours as needed. 100 mL Stuart Vernell Norris, PA-C   amoxicillin -clavulanate (AUGMENTIN ) 875-125 MG tablet Take 1 tablet by mouth every 12 (twelve) hours. 14 tablet Stuart Vernell Norris, PA-C   lidocaine  (XYLOCAINE ) 2 % solution Use as directed 10 mLs in the mouth or throat every 3 (three) hours as needed. 100 mL Stuart Vernell Norris, [place]      PDMP not reviewed this encounter.    [1]  Social History Tobacco Use   Smoking status: Former    Current packs/day: 0.00    Types:  Cigarettes    Quit date: 05/07/1991    Years since quitting: 33.1   Smokeless tobacco: Never  Vaping Use   Vaping status: Never Used  Substance Use Topics   Alcohol use: Not Currently    Comment: Rarely   Drug use: No     Stuart Vernell Norris, PA-C 06/17/24 1108

## 2024-06-17 NOTE — ED Triage Notes (Signed)
 Pt states that has some right upper dental pain.  Pt states that his had dental pain for a few years. Pt states that he needs dental extraction in the future.

## 2024-06-19 ENCOUNTER — Other Ambulatory Visit

## 2024-06-20 ENCOUNTER — Ambulatory Visit: Payer: Self-pay | Admitting: Family Medicine

## 2024-06-20 ENCOUNTER — Encounter: Payer: Self-pay | Admitting: Family Medicine

## 2024-06-20 ENCOUNTER — Ambulatory Visit: Admitting: Family Medicine

## 2024-06-20 VITALS — BP 118/78 | HR 75 | Temp 98.4°F | Ht 68.0 in | Wt 236.6 lb

## 2024-06-20 DIAGNOSIS — R972 Elevated prostate specific antigen [PSA]: Secondary | ICD-10-CM

## 2024-06-20 DIAGNOSIS — R809 Proteinuria, unspecified: Secondary | ICD-10-CM | POA: Diagnosis not present

## 2024-06-20 DIAGNOSIS — E1129 Type 2 diabetes mellitus with other diabetic kidney complication: Secondary | ICD-10-CM | POA: Diagnosis not present

## 2024-06-20 DIAGNOSIS — I152 Hypertension secondary to endocrine disorders: Secondary | ICD-10-CM

## 2024-06-20 DIAGNOSIS — E039 Hypothyroidism, unspecified: Secondary | ICD-10-CM

## 2024-06-20 DIAGNOSIS — E785 Hyperlipidemia, unspecified: Secondary | ICD-10-CM | POA: Diagnosis not present

## 2024-06-20 DIAGNOSIS — E1169 Type 2 diabetes mellitus with other specified complication: Secondary | ICD-10-CM

## 2024-06-20 DIAGNOSIS — E1159 Type 2 diabetes mellitus with other circulatory complications: Secondary | ICD-10-CM | POA: Diagnosis not present

## 2024-06-20 DIAGNOSIS — E1122 Type 2 diabetes mellitus with diabetic chronic kidney disease: Secondary | ICD-10-CM

## 2024-06-20 DIAGNOSIS — E113592 Type 2 diabetes mellitus with proliferative diabetic retinopathy without macular edema, left eye: Secondary | ICD-10-CM

## 2024-06-20 DIAGNOSIS — Z7984 Long term (current) use of oral hypoglycemic drugs: Secondary | ICD-10-CM

## 2024-06-20 DIAGNOSIS — Z789 Other specified health status: Secondary | ICD-10-CM

## 2024-06-20 DIAGNOSIS — E559 Vitamin D deficiency, unspecified: Secondary | ICD-10-CM

## 2024-06-20 LAB — MICROALBUMIN / CREATININE URINE RATIO
Creatinine,U: 79.7 mg/dL
Microalb Creat Ratio: 229.8 mg/g — ABNORMAL HIGH (ref 0.0–30.0)
Microalb, Ur: 18.3 mg/dL — ABNORMAL HIGH (ref 0.7–1.9)

## 2024-06-20 LAB — CBC WITH DIFFERENTIAL/PLATELET
Basophils Absolute: 0 K/uL (ref 0.0–0.1)
Basophils Relative: 0.2 % (ref 0.0–3.0)
Eosinophils Absolute: 0.2 K/uL (ref 0.0–0.7)
Eosinophils Relative: 2.8 % (ref 0.0–5.0)
HCT: 40.6 % (ref 39.0–52.0)
Hemoglobin: 13.8 g/dL (ref 13.0–17.0)
Lymphocytes Relative: 26.6 % (ref 12.0–46.0)
Lymphs Abs: 2.1 K/uL (ref 0.7–4.0)
MCHC: 33.9 g/dL (ref 30.0–36.0)
MCV: 89.2 fl (ref 78.0–100.0)
Monocytes Absolute: 1 K/uL (ref 0.1–1.0)
Monocytes Relative: 12.7 % — ABNORMAL HIGH (ref 3.0–12.0)
Neutro Abs: 4.5 K/uL (ref 1.4–7.7)
Neutrophils Relative %: 57.7 % (ref 43.0–77.0)
Platelets: 269 K/uL (ref 150.0–400.0)
RBC: 4.55 Mil/uL (ref 4.22–5.81)
RDW: 13.7 % (ref 11.5–15.5)
WBC: 7.8 K/uL (ref 4.0–10.5)

## 2024-06-20 LAB — TSH: TSH: 1.57 u[IU]/mL (ref 0.35–5.50)

## 2024-06-20 LAB — COMPREHENSIVE METABOLIC PANEL WITH GFR
ALT: 50 U/L (ref 3–53)
AST: 36 U/L (ref 5–37)
Albumin: 4.3 g/dL (ref 3.5–5.2)
Alkaline Phosphatase: 57 U/L (ref 39–117)
BUN: 21 mg/dL (ref 6–23)
CO2: 30 meq/L (ref 19–32)
Calcium: 9.3 mg/dL (ref 8.4–10.5)
Chloride: 101 meq/L (ref 96–112)
Creatinine, Ser: 1.11 mg/dL (ref 0.40–1.50)
GFR: 68.9 mL/min (ref >60.00)
Glucose, Bld: 70 mg/dL (ref 70–99)
Potassium: 3.8 meq/L (ref 3.5–5.1)
Sodium: 139 meq/L (ref 135–145)
Total Bilirubin: 0.4 mg/dL (ref 0.2–1.2)
Total Protein: 7.2 g/dL (ref 6.0–8.3)

## 2024-06-20 LAB — LIPID PANEL
Cholesterol: 165 mg/dL (ref 28–200)
HDL: 30.3 mg/dL — ABNORMAL LOW (ref >39.00)
LDL Cholesterol: 89 mg/dL (ref 10–99)
NonHDL: 135.04
Total CHOL/HDL Ratio: 5
Triglycerides: 231 mg/dL — ABNORMAL HIGH (ref 10.0–149.0)
VLDL: 46.2 mg/dL — ABNORMAL HIGH (ref 0.0–40.0)

## 2024-06-20 LAB — HEMOGLOBIN A1C: Hgb A1c MFr Bld: 7 % — ABNORMAL HIGH (ref 4.6–6.5)

## 2024-06-20 LAB — PSA, MEDICARE: PSA: 6.74 ng/mL — ABNORMAL HIGH (ref 0.10–4.00)

## 2024-06-20 MED ORDER — LOSARTAN POTASSIUM 50 MG PO TABS: 50.0000 mg | ORAL_TABLET | Freq: Every day | ORAL | 1 refills | Status: AC

## 2024-06-20 NOTE — Progress Notes (Signed)
 Please check and see if he read his Mychart result message from me. If not, please call him with important details. Thanks.

## 2024-06-20 NOTE — Progress Notes (Signed)
 Subjective:     Patient ID: Earl Harvey, male    DOB: 05/12/1956, 68 y.o.   MRN: [id number]  Chief Complaint  Patient presents with   Annual Exam    Has been on antibiotics since Saturday has not taken today and fasting     HPI  Discussed the use of AI scribe software for clinical note transcription with the patient, who gave verbal consent to proceed.  History of Present Illness Earl Harvey is a 68 year old male who presents for follow-up of chronic health conditions.  Dental pain and infection - Ongoing dental issues with significant intermittent pain, especially on Saturday nights - Currently taking antibiotics while awaiting oral surgery  Diabetes mellitus and glycemic control - Diabetes managed with low-dose Farxiga  - Uses Dexcom for glucose monitoring - Blood glucose generally in the 7 mmol/L range - Concern for diabetes affecting renal function - requests referral to new endocrinologist at [facility]. His daughter sees her  Dermatologic symptoms - Severe recurrent sores with bumps on scalp and arms since the summer - Intermittent use of prescribed steroid cream from dermatology and thick moisturizer - evaluation by dermatologist previously  Hypertension - Takes lisinopril  50 mg daily by splitting 100 mg tablets due to intolerance of full dose - Blood pressure stable on current regimen  Hyperlipidemia and statin intolerance - Takes fish oil 4000 mg daily and Zetia  - Statin intolerant due to adverse effects with pravastatin  - Prescribed Repatha  but has not initiated therapy - Previous LDL was 93 mg/dL  Urologic history - History of elevated PSA - No recent follow-up with previous urologist  Barriers to care - Recent move back to [facility] has increased travel distance for medical appointments, potentially affecting follow-up       There are no preventive care reminders to display for this patient.  Past Medical History:  Diagnosis Date    Arthritis    Asthma    Cataract Dec.2024   Clotting disorder    Diabetes mellitus    Diverticulitis    Elevated PSA 10/21/2015   Glaucoma    Gout    Hypercholesteremia    Hypertension    Kidney disease    Left foot pain 05/29/2022   Rash 07/30/2022   Thyroid  disease     Past Surgical History:  Procedure Laterality Date   CERVICAL DISCECTOMY     EYE SURGERY     JOINT REPLACEMENT  1995    Family History  Problem Relation Age of Onset   Hyperlipidemia Mother    COPD Mother    Hypertension Father    Hyperlipidemia Father    Arthritis Father    Alcohol abuse Father    Stroke Father    Heart failure Father    Heart disease Father    Hypertension Sister    Hyperlipidemia Sister    Hypertension Brother    Hyperlipidemia Brother    Depression Brother    Cancer Brother        lung   Arthritis Brother    Anxiety disorder Brother    Alcohol abuse Brother    Cancer Brother    Epilepsy Daughter    Colon cancer Neg Hx    Esophageal cancer Neg Hx    Stomach cancer Neg Hx    Rectal cancer Neg Hx     Social History   Socioeconomic History   Marital status: Divorced    Spouse name: Not on file   Number of children: 1  Years of education: 29   Highest education level: GED or equivalent  Occupational History   Occupation: RETIRED  Tobacco Use   Smoking status: Former    Current packs/day: 0.00    Types: Cigarettes    Quit date: 05/07/1991    Years since quitting: 33.1   Smokeless tobacco: Never  Vaping Use   Vaping status: Never Used  Substance and Sexual Activity   Alcohol use: Not Currently    Comment: Rarely   Drug use: No   Sexual activity: Not Currently  Other Topics Concern   Not on file  Social History Narrative   Has a roommate/2025   Social Drivers of Health   Tobacco Use: Medium Risk (06/20/2024)   Patient History    Smoking Tobacco Use: Former    Smokeless Tobacco Use: Never    Passive Exposure: Not on Actuary Strain:  High Risk (06/13/2024)   Overall Financial Resource Strain (CARDIA)    Difficulty of Paying Living Expenses: Hard  Food Insecurity: No Food Insecurity (06/13/2024)   Epic    Worried About Programme Researcher, Broadcasting/film/video in the Last Year: Never true    Ran Out of Food in the Last Year: Never true  Transportation Needs: No Transportation Needs (06/13/2024)   Epic    Lack of Transportation (Medical): No    Lack of Transportation (Non-Medical): No  Physical Activity: Sufficiently Active (06/13/2024)   Exercise Vital Sign    Days of Exercise per Week: 4 days    Minutes of Exercise per Session: 60 min  Stress: No Stress Concern Present (06/13/2024)   [employer] of Occupational Health - Occupational Stress Questionnaire    Feeling of Stress: Not at all  Social Connections: Socially Isolated (06/13/2024)   Social Connection and Isolation Panel    Frequency of Communication with Friends and Family: More than three times a week    Frequency of Social Gatherings with Friends and Family: Twice a week    Attends Religious Services: Never    Database Administrator or Organizations: No    Attends Engineer, Structural: Not on file    Marital Status: Divorced  Intimate Partner Violence: Patient Unable To Answer (02/01/2024)   Epic    Fear of Current or Ex-Partner: Patient unable to answer    Emotionally Abused: Patient unable to answer    Physically Abused: Patient unable to answer    Sexually Abused: Patient unable to answer  Depression (PHQ2-9): Low Risk (02/18/2024)   Depression (PHQ2-9)    PHQ-2 Score: 0  Alcohol Screen: Low Risk (06/13/2024)   Alcohol Screen    Last Alcohol Screening Score (AUDIT): 1  Housing: High Risk (06/13/2024)   Epic    Unable to Pay for Housing in the Last Year: No    Number of Times Moved in the Last Year: 3    Homeless in the Last Year: No  Utilities: Not At Risk (02/01/2024)   Epic    Threatened with loss of utilities: No  Health Literacy: Adequate Health  Literacy (02/01/2024)   B1300 Health Literacy    Frequency of need for help with medical instructions: Never    Outpatient Medications Prior to Visit  Medication Sig Dispense Refill   allopurinol  (ZYLOPRIM ) 300 MG tablet Take 1 tablet (300 mg total) by mouth every other day. 30 tablet 4   Alpha Lipoic Acid 200 MG CAPS Take 200 mg by mouth 2 (two) times daily.     amoxicillin -clavulanate (AUGMENTIN )  875-125 MG tablet Take 1 tablet by mouth every 12 (twelve) hours. 14 tablet 0   ascorbic acid (VITAMIN C) 500 MG tablet Take 1,000 mg by mouth daily.     aspirin EC 81 MG tablet Take 81 mg by mouth daily. Swallow whole.     Blood Glucose Monitoring Suppl DEVI Use to check blood sugars. May substitute to any manufacturer covered by patient's insurance. 1 each 0   brimonidine (ALPHAGAN) 0.15 % ophthalmic solution Place 1 drop into the left eye 2 (two) times daily.     chlorthalidone  (HYGROTON ) 25 MG tablet Take 1 tablet (25 mg total) by mouth daily. 90 tablet 1   Cholecalciferol (VITAMIN D3) 125 MCG (5000 UT) TABS Take 1 tablet by mouth daily.     co-enzyme Q-10 30 MG capsule Take 30 mg by mouth daily.     Continuous Glucose Sensor (DEXCOM G7 SENSOR) MISC 1 Device by Does not apply route continuous. 9 each 3   dapagliflozin  propanediol (FARXIGA ) 5 MG TABS tablet TAKE 1 TABLET BY MOUTH ONCE DAILY BEFORE BREAKFAST 30 tablet 2   ezetimibe  (ZETIA ) 10 MG tablet Take 1 tablet by mouth once daily 90 tablet 0   fish oil-omega-3 fatty acids 1000 MG capsule Take 2 g by mouth daily.   (Patient taking differently: Take 2 g by mouth daily. Taking 2000 mg twice a day)     Garlic 500 MG TABS Take 1 tablet by mouth daily.       Ginseng 250 MG CAPS Take 500 mg by mouth at bedtime.     Glucagon  (BAQSIMI  ONE PACK) 3 MG/DOSE POWD Place 1 Device into the nose as needed (Low blood sugar with impaired consciousness). 2 each 3   Glucose Blood (BLOOD GLUCOSE TEST STRIPS) STRP Use to check blood sugars 2 times daily. May  substitute to any manufacturer covered by patient's insurance. 200 strip 6   hydrOXYzine  (ATARAX ) 25 MG tablet Take 1 tablet (25 mg total) by mouth every 6 (six) hours as needed for itching. 12 tablet 0   ibuprofen  (ADVIL ,MOTRIN ) 800 MG tablet Take 1 tablet (800 mg total) by mouth 3 (three) times daily. (Patient taking differently: Take 800 mg by mouth 3 (three) times daily. PRN) 21 tablet 0   insulin  regular human CONCENTRATED (HUMULIN  R U-500 KWIKPEN) 500 UNIT/ML KwikPen 75 units before break fast, 55 units before lunch and 55 units before supper - 30 min before meals 39 mL 3   ketorolac (ACULAR) 0.5 % ophthalmic solution Place 1 drop into the left eye 4 (four) times daily.     Lancet Device MISC Use to check blood sugars. May substitute to any manufacturer covered by patient's insurance. 1 each 0   Lancets Misc. MISC Use to check blood sugars 2 times daily. May substitute to any manufacturer covered by patient's insurance. 200 each 6   levothyroxine  (SYNTHROID ) 125 MCG tablet TAKE 1 TABLET BY MOUTH IN THE MORNING 90 tablet 0   lidocaine  (XYLOCAINE ) 2 % solution Use as directed 10 mLs in the mouth or throat every 3 (three) hours as needed. 100 mL 0   Multiple Vitamins-Minerals (MULTIVITAMINS THER. W/MINERALS) TABS Take 1 tablet by mouth daily.       OVER THE COUNTER MEDICATION Berberine, one capsule daily.     prednisoLONE acetate (PRED FORTE) 1 % ophthalmic suspension Place 1 drop into the left eye 4 (four) times daily. (Patient taking differently: Place 1 drop into the left eye 4 (four) times daily. Using twice  a day)     Probiotic Product (PROBIOTIC DAILY PO) Take by mouth daily.     timolol (TIMOPTIC) 0.5 % ophthalmic solution 1 drop every morning.     triamcinolone  ointment (KENALOG ) 0.5 % Apply 1 Application topically 2 (two) times daily. 30 g 0   Turmeric (QC TUMERIC COMPLEX PO) Take by mouth daily.     losartan  (COZAAR ) 100 MG tablet Take 1 tablet by mouth once daily (Patient taking  differently: Take 100 mg by mouth daily. Taking 50mg  daily) 90 tablet 0   Evolocumab  (REPATHA  SURECLICK) 140 MG/ML SOAJ Inject 140 mg into the skin every 14 (fourteen) days. (Patient not taking: Reported on 06/20/2024) 2 mL 2   fluorometholone (FML) 0.1 % ophthalmic suspension Place 1 drop into the left eye 4 (four) times daily. (Patient not taking: Reported on 06/20/2024)     levocetirizine (XYZAL ) 5 MG tablet Take 1 tablet (5 mg total) by mouth every evening. (Patient not taking: Reported on 06/20/2024) 90 tablet 3   ofloxacin (OCUFLOX) 0.3 % ophthalmic solution Place 1 drop into the left eye 4 (four) times daily. (Patient not taking: Reported on 06/20/2024)     glimepiride  (AMARYL ) 2 MG tablet Take 2 mg by mouth 2 (two) times daily before a meal. (Patient not taking: Reported on 06/20/2024)     No facility-administered medications prior to visit.    Allergies[1]  Review of Systems  Constitutional:  Negative for chills and fever.  Respiratory:  Negative for shortness of breath.   Cardiovascular:  Negative for chest pain, palpitations and leg swelling.  Gastrointestinal:  Negative for abdominal pain, constipation, diarrhea, nausea and vomiting.  Genitourinary:  Negative for dysuria, frequency and urgency.  Skin:  Positive for rash.  Neurological:  Negative for dizziness and headaches.       Objective:    Physical Exam Constitutional:      General: He is not in acute distress.    Appearance: He is not ill-appearing.  Eyes:     Extraocular Movements: Extraocular movements intact.     Conjunctiva/sclera: Conjunctivae normal.  Cardiovascular:     Rate and Rhythm: Normal rate.  Pulmonary:     Effort: Pulmonary effort is normal.  Musculoskeletal:     Cervical back: Normal range of motion and neck supple.  Skin:    General: Skin is warm and dry.  Neurological:     General: No focal deficit present.     Mental Status: He is alert and oriented to person, place, and time.   Psychiatric:        Mood and Affect: Mood normal.        Behavior: Behavior normal.        Thought Content: Thought content normal.      BP 118/78 (BP Location: Left Arm, Patient Position: Sitting)   Pulse 75   Temp 98.4 F (36.9 C) (Temporal)   Ht 5' 8 (1.727 m)   Wt 236 lb 9.6 oz (107.3 kg)   SpO2 97%   BMI 35.97 kg/m  Wt Readings from Last 3 Encounters:  06/20/24 236 lb 9.6 oz (107.3 kg)  06/17/24 230 lb (104.3 kg)  04/04/24 233 lb (105.7 kg)       Assessment & Plan:   Problem List Items Addressed This Visit     Acquired hypothyroidism   Relevant Orders   TSH (Completed)   Chronic kidney disease due to type 2 diabetes mellitus (HCC)   Relevant Medications   losartan  (COZAAR ) 50 MG tablet  Other Relevant Orders   Microalbumin / creatinine urine ratio (Completed)   Ambulatory referral to Nephrology   Hyperlipidemia associated with type 2 diabetes mellitus (HCC)   Relevant Medications   losartan  (COZAAR ) 50 MG tablet   Other Relevant Orders   Lipid panel (Completed)   Hypertension associated with type 2 diabetes mellitus (HCC)   Relevant Medications   losartan  (COZAAR ) 50 MG tablet   Other Relevant Orders   CBC with Differential/Platelet (Completed)   Comprehensive metabolic panel with GFR (Completed)   TSH (Completed)   Statin intolerance   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, without long-term current use of insulin  (HCC) - Primary   Relevant Medications   losartan  (COZAAR ) 50 MG tablet   Other Relevant Orders   Ambulatory referral to Endocrinology   CBC with Differential/Platelet (Completed)   Comprehensive metabolic panel with GFR (Completed)   Hemoglobin A1c (Completed)   Urine test positive for microalbuminuria   Relevant Orders   Microalbumin / creatinine urine ratio (Completed)   Vitamin D deficiency   Other Visit Diagnoses       Elevated PSA       Relevant Orders   PSA,  Medicare (Completed)     Microalbuminuria due to type 2 diabetes mellitus (HCC)       Relevant Medications   losartan  (COZAAR ) 50 MG tablet   Other Relevant Orders   Ambulatory referral to Nephrology       Assessment and Plan Assessment & Plan Type 2 diabetes mellitus with multiple complications Diabetes is contributing to kidney issues. Blood sugar levels are well-controlled per patient. Current medications include Farxiga  and Zetia . LDL cholesterol is elevated at 93, above the target of less than 70 due to diabetes. He is on a low dose of Farxiga  and is considering increasing the dose if kidney function allows. - Continue Farxiga  and insulin . Managed by endocrinology  - Checked cholesterol levels. - Monitor kidney function   - worsening urine microalbumin and nephrology referral placed    Acquired hypothyroidism - check TSH - continue levothyroxine  125 mcg  Statin intolerance Intolerance to pravastatin  due to adverse effects. Currently using Zetia , fish oil supplements and Repatha  injections. Awaiting results of current cholesterol management with fish oil. - Checked cholesterol levels. - Continue fish oil supplements. - Will consider Repatha  injections if cholesterol levels remain elevated.  Elevated PSA PSA levels have been elevated in the past. Last checked by another provider, advised to wait another year. He is open to checking PSA levels again. - Checked PSA levels. - PSA elevated. Advised patient to follow up with urologist.   General health maintenance He is on lisinopril  50 mg due to adverse effects at higher doses. Blood pressure is well-controlled. He is taking fish oil supplements. - Continue lisinopril  50 mg. - Continue fish oil supplements.     I have discontinued Ebert M[employer] Zee's glimepiride  and losartan . I am also having him start on losartan . Additionally, I am having him maintain his fish oil-omega-3 fatty acids, Garlic, multivitamins ther. w/minerals,  ibuprofen , Turmeric (QC TUMERIC COMPLEX PO), Vitamin D3, aspirin EC, triamcinolone  ointment, levocetirizine, hydrOXYzine , Blood Glucose Monitoring Suppl, BLOOD GLUCOSE TEST STRIPS, Lancet Device, Lancets Misc., allopurinol , Dexcom G7 Sensor, Baqsimi  One Pack, HumuLIN  R U-500 KwikPen, chlorthalidone , OVER THE COUNTER MEDICATION, Farxiga , fluorometholone, ketorolac, ofloxacin, prednisoLONE acetate, Ginseng, ascorbic acid, co-enzyme Q-10, Alpha Lipoic Acid, Probiotic Product (PROBIOTIC DAILY PO), Repatha  SureClick, ezetimibe , levothyroxine , amoxicillin -clavulanate, lidocaine , brimonidine, and timolol.  Meds  ordered this encounter  Medications   losartan  (COZAAR ) 50 MG tablet    Sig: Take 1 tablet (50 mg total) by mouth daily.    Dispense:  90 tablet    Refill:  1       [1] No Known Allergies

## 2024-06-21 ENCOUNTER — Other Ambulatory Visit: Payer: Self-pay | Admitting: Family Medicine

## 2024-06-21 NOTE — Telephone Encounter (Unsigned)
 Copied from CRM #[id number]. Topic: Clinical - Medication Refill >> Jun 21, 2024  4:01 PM Hadassah PARAS wrote: Medication: levothyroxine  (SYNTHROID ) 125 MCG tablet   Has the patient contacted their pharmacy? Yes (Agent: If no, request that the patient contact the pharmacy for the refill. If patient does not wish to contact the pharmacy document the reason why and proceed with request.) (Agent: If yes, when and what did the pharmacy advise?)  This is the patient's preferred pharmacy:     [id number] - [place], [place] - [address] [place] [address] Phone: [phone] Fax: [phone]  Is this the correct pharmacy for this prescription? Yes If no, delete pharmacy and type the correct one.   Has the prescription been filled recently? Yes  Is the patient out of the medication? No  Has the patient been seen for an appointment in the last year OR does the patient have an upcoming appointment? Yes  Can we respond through MyChart? Yes  Agent: Please be advised that Rx refills may take up to 3 business days. We ask that you follow-up with your pharmacy.

## 2024-06-22 ENCOUNTER — Encounter (INDEPENDENT_AMBULATORY_CARE_PROVIDER_SITE_OTHER): Admitting: Ophthalmology

## 2024-06-22 DIAGNOSIS — Z7984 Long term (current) use of oral hypoglycemic drugs: Secondary | ICD-10-CM

## 2024-06-22 DIAGNOSIS — H43812 Vitreous degeneration, left eye: Secondary | ICD-10-CM | POA: Diagnosis not present

## 2024-06-22 DIAGNOSIS — I10 Essential (primary) hypertension: Secondary | ICD-10-CM | POA: Diagnosis not present

## 2024-06-22 DIAGNOSIS — E113512 Type 2 diabetes mellitus with proliferative diabetic retinopathy with macular edema, left eye: Secondary | ICD-10-CM | POA: Diagnosis not present

## 2024-06-22 DIAGNOSIS — H35032 Hypertensive retinopathy, left eye: Secondary | ICD-10-CM

## 2024-06-22 DIAGNOSIS — Z794 Long term (current) use of insulin: Secondary | ICD-10-CM | POA: Diagnosis not present

## 2024-06-22 DIAGNOSIS — H59032 Cystoid macular edema following cataract surgery, left eye: Secondary | ICD-10-CM

## 2024-06-22 MED ORDER — LEVOTHYROXINE SODIUM 125 MCG PO TABS: 125.0000 ug | ORAL_TABLET | Freq: Every morning | ORAL | 0 refills | Status: DC

## 2024-06-23 ENCOUNTER — Other Ambulatory Visit: Payer: Self-pay | Admitting: "Endocrinology

## 2024-06-23 DIAGNOSIS — E1165 Type 2 diabetes mellitus with hyperglycemia: Secondary | ICD-10-CM

## 2024-06-27 ENCOUNTER — Other Ambulatory Visit: Payer: Self-pay | Admitting: Family Medicine

## 2024-06-27 DIAGNOSIS — E1165 Type 2 diabetes mellitus with hyperglycemia: Secondary | ICD-10-CM

## 2024-06-27 NOTE — Telephone Encounter (Signed)
 Copied from CRM #[id number]. Topic: Clinical - Medication Refill >> Jun 27, 2024  7:56 AM Pinkey ORN wrote: Medication: dapagliflozin  propanediol (FARXIGA ) 5 MG TABS tablet  Has the patient contacted their pharmacy? Yes (Agent: If no, request that the patient contact the pharmacy for the refill. If patient does not wish to contact the pharmacy document the reason why and proceed with request.) (Agent: If yes, when and what did the pharmacy advise?)  This is the patient's preferred pharmacy:   [facility] - [place], [place] - [address] [place] [address] Phone: [phone] Fax: [phone]  Is this the correct pharmacy for this prescription? Yes If no, delete pharmacy and type the correct one.   Has the prescription been filled recently? Yes  Is the patient out of the medication? No  Has the patient been seen for an appointment in the last year OR does the patient have an upcoming appointment? Yes  Can we respond through MyChart? Yes  Agent: Please be advised that Rx refills may take up to 3 business days. We ask that you follow-up with your pharmacy.

## 2024-07-07 ENCOUNTER — Other Ambulatory Visit: Payer: Self-pay | Admitting: Family Medicine

## 2024-07-22 ENCOUNTER — Other Ambulatory Visit: Payer: Self-pay | Admitting: Family Medicine

## 2024-08-03 ENCOUNTER — Encounter (INDEPENDENT_AMBULATORY_CARE_PROVIDER_SITE_OTHER): Admitting: Ophthalmology

## 2024-08-15 ENCOUNTER — Encounter (INDEPENDENT_AMBULATORY_CARE_PROVIDER_SITE_OTHER): Admitting: Ophthalmology

## 2024-10-19 ENCOUNTER — Ambulatory Visit: Admitting: Family Medicine

## 2025-02-01 ENCOUNTER — Ambulatory Visit
